# Patient Record
Sex: Female | Born: 1960
Health system: Southern US, Community
[De-identification: ages and names within clinical notes are randomized; demographics above are authoritative.]

## PROBLEM LIST (undated history)

## (undated) DIAGNOSIS — I10 Essential (primary) hypertension: Secondary | ICD-10-CM

## (undated) DIAGNOSIS — E079 Disorder of thyroid, unspecified: Secondary | ICD-10-CM

## (undated) DIAGNOSIS — M1990 Unspecified osteoarthritis, unspecified site: Secondary | ICD-10-CM

---

## 2004-04-17 ENCOUNTER — Encounter: Admission: RE | Admit: 2004-04-17 | Discharge: 2004-04-17 | Payer: Self-pay | Admitting: Internal Medicine

## 2005-03-16 ENCOUNTER — Encounter: Admission: RE | Admit: 2005-03-16 | Discharge: 2005-03-16 | Payer: Self-pay | Admitting: Internal Medicine

## 2005-03-19 ENCOUNTER — Encounter (INDEPENDENT_AMBULATORY_CARE_PROVIDER_SITE_OTHER): Payer: Self-pay | Admitting: Specialist

## 2005-03-19 ENCOUNTER — Ambulatory Visit (HOSPITAL_COMMUNITY): Admission: RE | Admit: 2005-03-19 | Discharge: 2005-03-19 | Payer: Self-pay | Admitting: Obstetrics and Gynecology

## 2006-11-05 ENCOUNTER — Encounter: Admission: RE | Admit: 2006-11-05 | Discharge: 2006-11-05 | Payer: Self-pay | Admitting: Internal Medicine

## 2010-06-30 NOTE — H&P (Signed)
Evelyn Johnston, Evelyn Johnston           ACCOUNT NO.:  [id number]   MEDICAL RECORD NO.:  [id number]          PATIENT TYPE:  AMB   LOCATION:                                FACILITY:  [facility]   PHYSICIAN:  James A. Ashley Royalty, M.D.DATE OF BIRTH:  [birth year]   DATE OF ADMISSION:  03/19/2005  DATE OF DISCHARGE:                                HISTORY & PHYSICAL   HISTORY OF PRESENT ILLNESS:  This is a 50 year old gravida 3, para 1, AB 1.  Last menstrual period January 08, 2005, placing her at approximately 9  weeks 4 days gestation. The patient presented to her primary care physician,  Dr. Ludwig Clarks, today with amenorrhea. A blood pregnancy test was ordered and  the result positive. In addition, an ultrasound was obtained at [facility], which revealed an 8 week 0 day intrauterine pregnancy with no  cardiac activity. The patient consents for D&C due to missed abortion.   MEDICATIONS:  Lotrel.   PAST MEDICAL HISTORY:  Hypertension.   PAST SURGICAL HISTORY:  Negative.   ALLERGIES:  Negative.   FAMILY HISTORY:  Positive for hypertension.   SOCIAL HISTORY:  The patient smokes 1 pack of cigarettes per day. She does  not use alcohol.   REVIEW OF SYSTEMS:  Noncontributory.   PHYSICAL EXAMINATION:  GENERAL:  A well developed, well nourished, pleasant,  black female in no acute distress.  VITAL SIGNS:  Afebrile. Vital signs stable.  SKIN:  Warm and dry without lesions.  CHEST:  Lungs were clear.  CARDIAC:  Regular rate and rhythm.  BREAST:  Examination reveals no dominant masses.  ABDOMEN:  Soft, nontender, without masses or organomegaly. Bowel sounds are  active.  MUSCULOSKELETAL:  No CVA tenderness.  PELVIC:  External genitalia within normal limits. Vagina and cervix were  without gross lesions. Bimanual examination reveals uterus to be  approximately 10 x 6 x 5 cm and no adnexal masses are palpable.   LABORATORY DATA:  Ultrasound from [facility] dated March 16, 2005  reviewed. The study reveals an intrauterine pregnancy of 8 weeks, 0 days  gestation with no fetal heart motion identified, consistent with an  embryonic demise. There is also a 4.1 cm simple left ovarian cyst,  consistent with a corpus luteum.   IMPRESSION:  1.  Missed abortion at approximately [redacted] weeks gestation.  2.  Left adnexal cyst, consistent with a corpus luteum.   PLAN:  Suction, dilatation, and curettage. Risks, benefits, and alternatives  fully discussed with the patient. She states she understands and accepts.  Questions invited and answered.      James A. Ashley Royalty, M.D.  Electronically Signed     JAM/MEDQ  D:  03/16/2005  T:  03/16/2005  Job:  [id number]   cc:   [name], MD  Fax: [phone]

## 2010-06-30 NOTE — Op Note (Signed)
Evelyn Johnston, Evelyn Johnston           ACCOUNT NO.:  [id number]   MEDICAL RECORD NO.:  [id number]          PATIENT TYPE:  AMB   LOCATION:  SDC                           FACILITY:  [facility]   PHYSICIAN:  James A. Ashley Royalty, M.D.DATE OF BIRTH:  08-16-1960   DATE OF PROCEDURE:  03/19/2005  DATE OF DISCHARGE:                                 OPERATIVE REPORT   PREOPERATIVE DIAGNOSIS:  Missed abortion at approximately 8 weeks'  gestation.   POSTOPERATIVE DIAGNOSIS:  Missed abortion at approximately 8 weeks'  gestation, pathology pending.   PROCEDURE:  Suction dilatation and curettage.   SURGEON:  Rudy Jew. Ashley Royalty, M.D.   ANESTHESIA:  Monitoring anesthesia care with 1% Xylocaine paracervical block  (20 mL).   FINDINGS:  The uterus sounded to approximately 11 cm.  A moderate amount of  apparent products of conception was obtained.   ESTIMATED BLOOD LOSS:  Less than 50 mL.   COMPLICATIONS:  None.   PACKS AND DRAINS:  None.   PROCEDURE:  The patient was taken to the operating room and placed in a  dorsal supine position.  After IV sedation was administered, she was placed  in the lithotomy position and prepped and draped in the usual manner for  vaginal surgery.  A posterior weighted retractor was placed per vagina and  the anterior lip of the cervix was grasped with single-tooth tenaculum.  Approximately 20 mL of 1% Xylocaine were instilled circumferentially around  the cervix to create a paracervical block.  The uterus was then gently  sounded to 11 cm.  The cervix was verified as already dilated to  approximately 31-French using Shawnie Pons dilators.  A 10 mm suction curette was  then introduced into the uterus.  Suction was applied and a moderate amount  of apparent products of conception was delivered through the tubing.  After  several passes with the suction curette, no additional tissue was obtained.  At this point the patient was felt to have benefited maximally from the  surgical  procedure.  The tenaculum was removed and a small bleeder from the  tenaculum site was easily closed with a 2-0 chromic catgut in an interrupted  fashion.  Hemostasis was noted and the procedure terminated.   The patient tolerated the procedure extremely well and was returned to the  recovery room in good condition.  Blood type is O+.      James A. Ashley Royalty, M.D.  Electronically Signed     JAM/MEDQ  D:  03/19/2005  T:  03/19/2005  Job:  [id number]

## 2013-12-07 ENCOUNTER — Encounter (HOSPITAL_BASED_OUTPATIENT_CLINIC_OR_DEPARTMENT_OTHER): Payer: Self-pay | Admitting: Emergency Medicine

## 2013-12-07 ENCOUNTER — Emergency Department (HOSPITAL_BASED_OUTPATIENT_CLINIC_OR_DEPARTMENT_OTHER)
Admission: EM | Admit: 2013-12-07 | Discharge: 2013-12-07 | Disposition: A | Discharge status: Home / Self Care | Payer: Self-pay | Attending: Emergency Medicine | Admitting: Emergency Medicine

## 2013-12-07 ENCOUNTER — Emergency Department (HOSPITAL_BASED_OUTPATIENT_CLINIC_OR_DEPARTMENT_OTHER): Payer: Self-pay

## 2013-12-07 DIAGNOSIS — E0590 Thyrotoxicosis, unspecified without thyrotoxic crisis or storm: Secondary | ICD-10-CM | POA: Insufficient documentation

## 2013-12-07 DIAGNOSIS — M25551 Pain in right hip: Secondary | ICD-10-CM | POA: Insufficient documentation

## 2013-12-07 DIAGNOSIS — M25559 Pain in unspecified hip: Secondary | ICD-10-CM

## 2013-12-07 DIAGNOSIS — I10 Essential (primary) hypertension: Secondary | ICD-10-CM | POA: Insufficient documentation

## 2013-12-07 DIAGNOSIS — R251 Tremor, unspecified: Secondary | ICD-10-CM | POA: Insufficient documentation

## 2013-12-07 DIAGNOSIS — M1611 Unilateral primary osteoarthritis, right hip: Secondary | ICD-10-CM | POA: Insufficient documentation

## 2013-12-07 DIAGNOSIS — G8929 Other chronic pain: Secondary | ICD-10-CM | POA: Insufficient documentation

## 2013-12-07 DIAGNOSIS — Z720 Tobacco use: Secondary | ICD-10-CM | POA: Insufficient documentation

## 2013-12-07 HISTORY — DX: Essential (primary) hypertension: I10

## 2013-12-07 HISTORY — DX: Unspecified osteoarthritis, unspecified site: M19.90

## 2013-12-07 HISTORY — DX: Disorder of thyroid, unspecified: E07.9

## 2013-12-07 MED ORDER — KETOROLAC TROMETHAMINE 60 MG/2ML IM SOLN
60.0000 mg | Freq: Once | INTRAMUSCULAR | Status: AC
  Administered 2013-12-07: 60 mg via INTRAMUSCULAR
  Filled 2013-12-07: qty 2

## 2013-12-07 MED ORDER — TRAMADOL HCL 50 MG PO TABS: 50.0000 mg | ORAL_TABLET | Freq: Four times a day (QID) | ORAL | Status: DC | PRN

## 2013-12-07 NOTE — ED Notes (Signed)
PA at bedside discussing test results and dispo plan of care. 

## 2013-12-07 NOTE — ED Notes (Signed)
Pt c/o right hip pain x2 weeks that became worse yesterday. Pt sts she has been taking meloxicam but they are no longer helping. Pt refused wheelchair.

## 2013-12-07 NOTE — Discharge Instructions (Signed)
1. Medications: tramadol, usual home medications 2. Treatment: rest, drink plenty of fluids, gentle stretching 3. Follow Up: Please followup with your primary doctor in 3 days days for discussion of your diagnoses and further evaluation after today's visit; if you do not have a primary care doctor use the resource guide provided to find one; Please return to the ER for fever, chills, confusion or worsening symptoms   Arthralgia Your caregiver has diagnosed you as suffering from an arthralgia. Arthralgia means there is pain in a joint. This can come from many reasons including:  Bruising the joint which causes soreness (inflammation) in the joint.  Wear and tear on the joints which occur as we grow older (osteoarthritis).  Overusing the joint.  Various forms of arthritis.  Infections of the joint. Regardless of the cause of pain in your joint, most of these different pains respond to anti-inflammatory drugs and rest. The exception to this is when a joint is infected, and these cases are treated with antibiotics, if it is a bacterial infection. HOME CARE INSTRUCTIONS   Rest the injured area for as long as directed by your caregiver. Then slowly start using the joint as directed by your caregiver and as the pain allows. Crutches as directed may be useful if the ankles, knees or hips are involved. If the knee was splinted or casted, continue use and care as directed. If an stretchy or elastic wrapping bandage has been applied today, it should be removed and re-applied every 3 to 4 hours. It should not be applied tightly, but firmly enough to keep swelling down. Watch toes and feet for swelling, bluish discoloration, coldness, numbness or excessive pain. If any of these problems (symptoms) occur, remove the ace bandage and re-apply more loosely. If these symptoms persist, contact your caregiver or return to this location.  For the first 24 hours, keep the injured extremity elevated on pillows while  lying down.  Apply ice for 15-20 minutes to the sore joint every couple hours while awake for the first half day. Then 03-04 times per day for the first 48 hours. Put the ice in a plastic bag and place a towel between the bag of ice and your skin.  Wear any splinting, casting, elastic bandage applications, or slings as instructed.  Only take over-the-counter or prescription medicines for pain, discomfort, or fever as directed by your caregiver. Do not use aspirin immediately after the injury unless instructed by your physician. Aspirin can cause increased bleeding and bruising of the tissues.  If you were given crutches, continue to use them as instructed and do not resume weight bearing on the sore joint until instructed. Persistent pain and inability to use the sore joint as directed for more than 2 to 3 days are warning signs indicating that you should see a caregiver for a follow-up visit as soon as possible. Initially, a hairline fracture (break in bone) may not be evident on X-rays. Persistent pain and swelling indicate that further evaluation, non-weight bearing or use of the joint (use of crutches or slings as instructed), or further X-rays are indicated. X-rays may sometimes not show a small fracture until a week or 10 days later. Make a follow-up appointment with your own caregiver or one to whom we have referred you. A radiologist (specialist in reading X-rays) may read your X-rays. Make sure you know how you are to obtain your X-ray results. Do not assume everything is normal if you do not hear from [place]. SEEK MEDICAL CARE  IF: Bruising, swelling, or pain increases. SEEK IMMEDIATE MEDICAL CARE IF:   Your fingers or toes are numb or blue.  The pain is not responding to medications and continues to stay the same or get worse.  The pain in your joint becomes severe.  You develop a fever over 102 F (38.9 C).  It becomes impossible to move or use the joint. MAKE SURE YOU:   Understand  these instructions.  Will watch your condition.  Will get help right away if you are not doing well or get worse. Document Released: 01/29/2005 Document Revised: 04/23/2011 Document Reviewed: 09/17/2007 [facility] Patient Information 2015 [place], [place]. This information is not intended to replace advice given to you by your health care provider. Make sure you discuss any questions you have with your health care provider.   Emergency Department Resource Guide 1) Find a Doctor and Pay Out of Pocket Although you won't have to find out who is covered by your insurance plan, it is a good idea to ask around and get recommendations. You will then need to call the office and see if the doctor you have chosen will accept you as a new patient and what types of options they offer for patients who are self-pay. Some doctors offer discounts or will set up payment plans for their patients who do not have insurance, but you will need to ask so you aren't surprised when you get to your appointment.  2) Contact Your Local Health Department Not all health departments have doctors that can see patients for sick visits, but many do, so it is worth a call to see if yours does. If you don't know where your local health department is, you can check in your phone book. The CDC also has a tool to help you locate your state's health department, and many state websites also have listings of all of their local health departments.  3) Find a Walk-in Clinic If your illness is not likely to be very severe or complicated, you may want to try a walk in clinic. These are popping up all over the country in pharmacies, drugstores, and shopping centers. They're usually staffed by nurse practitioners or physician assistants that have been trained to treat common illnesses and complaints. They're usually fairly quick and inexpensive. However, if you have serious medical issues or chronic medical problems, these are probably not your best  option.  No Primary Care Doctor: - Call Health Connect at  [phone] - they can help you locate a primary care doctor that  accepts your insurance, provides certain services, etc. - Physician Referral Service- [phone]  Chronic Pain Problems: Organization         Address  Phone   Notes  [facility]  [phone]) [phone] Patients need to be referred by their primary care doctor.   Medication Assistance: Organization         Address  Phone   Notes  [facility] [address] [place], [place] [address] [phone]) [phone] --Must be a resident of [facility] [place] -- Must have NO insurance coverage whatsoever (no Medicaid/ Medicare, etc.) -- The pt. MUST have a primary care doctor that directs their care regularly and follows them in the community   Med[facility]ist  [phone]) [phone]   [employer]  [phone]    Agencies that provide inexpensive medical care: Organization         Address  Phone   Notes  [facility]  [phone]) [phone]   [facility]    [phone]   [facility] [address] [place], [place] [address] [phone]) [phone]   [facility] [address], [place] [phone](336) [phone]   Planned Parenthood    [phone]) [phone]   [facility]    [phone](336) [phone]   [facility]  [address], [place] Phone:  [phone]) [phone], Fax:  [phone] Hours of Operation:  9 am - 6 pm, M-F.  Also accepts Medicaid/Medicare and self-pay.  [facility]  [address], [place] Phone: [phone]) [phone], Fax: [phone]. Hours of Operation:  8:30 am - 5:30 pm, M-F.  Also accepts Medicaid and self-pay.  [facility] [place] [address], [place] Phone: [phone]) [phone]   [facility] [address], [place], [place] [phone], Ext. [phone] Mondays & Thursdays: 7-9 AM.  First 15  patients are seen on a first come, first serve basis.    Medicaid-accepting [facility] Providers:  Organization         Address  Phone   Notes  [facility] [address], [place] [phone] Also accepts self-pay patients.  [facility] [address], [place]  [phone]) [phone]   [facility] [address], [place] [phone]) [phone]   [facility] [address] [place] [address], [place] [phone]) [phone]   Renaye RakersVeita Bland [address], [place]   [phone] Only accepts [place] Access [place] patients after they have their name applied to their card.   Self-Pay (no insurance) in [facility] [place]:  Organization         Address  Phone   Notes  Sickle Cell Patients, [facility] [address], [place] [phone]) [phone]   [facility] [address] [place] [address], [place] [phone]) [phone]   [facility] [place]  [address], [place] [phone]) [phone]   [facility]/Dr. Osei-Bonsu  [address] [place] [address], [place] or [id number], [place] [phone]) [phone] Phone number for both [place] and [place] locations is the same.  [facility] [address], [place] [phone]) [phone]   [facility] [address] [place] [address], [place] or [address] [phone]   [facility] [address], [place] [phone]) [phone], phone; [phone]) [phone], fax Sees patients 1st and 3rd Saturday of every month.  Must not qualify for public or private insurance (i.e. Medicaid, Medicare, [place] Health Choice, Veterans' Benefits)  Household income should be no more than 200% of the poverty level The clinic cannot treat you if you are pregnant or think you are pregnant  Sexually transmitted diseases are not treated at the clinic.    Dental  Care: Organization         Address  Phone  Notes  [facility] [address], [place] [phone] Accepts children up to age 52 who are enrolled in [place] or [place] Health Choice; pregnant women with a Medicaid card; and children who have applied for Medicaid or [place] Health Choice, but were declined, whose parents can pay a reduced fee at time of service.  [facility] Department of [employer]Public Health  [place]  [address], [place] [phone] Accepts children up to age 51 who are enrolled in Medicaid or [place] Health Choice; pregnant women with a Medicaid card; and children who have applied for Medicaid or [place] Health Choice, but were declined, whose parents can pay a reduced fee at time of service.  [facility]  [address] [place], [place] [phone] Patients are seen by appointment only. Walk-ins are not accepted. [facility] will see patients 64 years of age and older. Monday - Tuesday (8am-5pm) Most Wednesdays (8:30-5pm) $30 per visit, cash only  [facility] Access [facility]  [address], [facility] [phone] Patients are seen by appointment only. Walk-ins are not accepted. [facility] will see patients 34 years of age and older. One Wednesday Evening (Monthly: Volunteer Based).  $30 per visit, cash only  [employer]  [phone] for adults; Children under age 39, call Graduate Pediatric Dentistry at [phone]. Children aged 56-14, please call [phone] to request a pediatric application.  Dental services are provided in all areas of dental care including fillings, crowns and bridges, complete and partial dentures, implants, gum treatment, root canals, and extractions. Preventive care is also provided. Treatment is provided to both adults and children. Patients are selected via a lottery and there is often a waiting list.   [facility] [address], [place]  [phone] [email or web address]   [facility] [address] [place], [place] [phone], Ext. [phone] Second and Fourth Thursday of each month, opens at 6:30 AM; Clinic ends at 9 AM.  Patients are seen on a first-come first-served basis, and a limited number are seen during each clinic.   [facility]  [address] [place], [place] [phone]   Eligibility Requirements You must have lived in [place], [place], or [place] counties for at least the last three months.   You cannot be eligible for state or federal sponsored [place], including [employer], [place], or [employer].   You generally cannot be eligible for healthcare insurance through your employer.    How to apply: Eligibility screenings are held every Tuesday and Wednesday afternoon from 1:00 pm until 4:00 pm. You do not need an appointment for the interview!  [facility] [address] [place], [place] [phone]   [facility] Health Department  [phone]   [facility] Health Department  [phone]   [facility] Health Department  [phone]    Behavioral Health Resources in the Community: Intensive Outpatient Programs Organization         Address  Phone  Notes  [facility] [address], [place], [place] [phone]   [facility] Outpatient [address] [place], [place] [phone]   [facility] [address], [place], [place]  [phone]   [facility] Mental Health [address],  [place], [place] [phone] or [phone]   Substance Abuse Resources Organization         Address  Phone  Notes  [facility] Drug Services  [phone]   [facility]  [phone]   [facility]  [phone]   Floydene Flock  [phone]   Residential & Outpatient Substance Abuse Program  [phone]    Psychological Services Organization         Address  Phone  Notes  [facility]  [phone]  [phone]   [facility] Services  [phone]   [facility] [address] [place] [phone] or [phone]    Mobile Crisis Teams Organization         Address  Phone  Notes  [facility], Mobile Crisis Care Unit  [phone]   [facility]  [address] [place], [place] [phone]   Doristine Locks [address] [place] [phone]    Self-Help/Support Groups Organization         Address  Phone             Notes  [facility] [place] - variety of support groups  336- I7437963 Call for more information  Narcotics Anonymous (NA), [facility] [address], [employer] [place]  2 meetings at this location   Statistician         Address  Phone  Notes  [facility] [address],    [place]  [phone]   [facility]  [address] [place] [address], [place], [place] [phone]   [facility] [address] [place], [place] [phone] Admissions: 8am-3pm M-F  [facility] [address],    [place], [place] [phone]   [facility] [address] [place], [place], [place] [phone]   [facility] [address]  [place], [place] [phone]   [facility] - Intensive Outpatient [address], [place], [place] [phone]   [facility] ([facility] [address] [place].,  [place], [place] [phone] or [phone]) [phone]   [facility] ([facility]) [address] [place], [place] [phone] Accepts Medicaid  [place] [address]  [place] [phone] Substance Abuse/Addiction Treatment   [facility] Organization         Address  Phone  Notes  [facility]  5124929560   Angie Fava, PhD [address]vin Knack [place], [place]   [phone] or [phone]   [facility]al   [address] [place], [place] [phone]   [facility] [address], [place], [place] [phone] Insurance/Medicaid/sponsorship through [facility] and Families [address]                                    [place], [place] [phone] Therapy/tele-psych/case  [facility] [address], [place] [phone]    Dr. Lolly Mustache  [phone]   [facility]  [employer] [facility] Dept. 1) [address] [place] 2) [address], [place] 3)  [address], [place] [phone]  [phone]   [place] [facility] Child Abuse Hotline 216-086-8250 or [phone] (After Hours)
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
## 2013-12-08 NOTE — ED Provider Notes (Signed)
Medical screening examination/treatment/procedure(s) were performed by non-physician practitioner and as supervising physician I was immediately available for consultation/collaboration.   EKG Interpretation None        Purvis SheffieldForrest Rheagan Nayak, MD 12/08/13 1208

## 2014-05-10 ENCOUNTER — Ambulatory Visit: Payer: Self-pay | Admitting: Family Medicine

## 2015-05-30 ENCOUNTER — Emergency Department (HOSPITAL_BASED_OUTPATIENT_CLINIC_OR_DEPARTMENT_OTHER)
Admission: EM | Admit: 2015-05-30 | Discharge: 2015-05-31 | Disposition: A | Discharge status: Home / Self Care | Payer: Self-pay | Attending: Emergency Medicine | Admitting: Emergency Medicine

## 2015-05-30 ENCOUNTER — Encounter (HOSPITAL_BASED_OUTPATIENT_CLINIC_OR_DEPARTMENT_OTHER): Payer: Self-pay

## 2015-05-30 DIAGNOSIS — M160 Bilateral primary osteoarthritis of hip: Secondary | ICD-10-CM | POA: Insufficient documentation

## 2015-05-30 DIAGNOSIS — F17200 Nicotine dependence, unspecified, uncomplicated: Secondary | ICD-10-CM | POA: Insufficient documentation

## 2015-05-30 DIAGNOSIS — I10 Essential (primary) hypertension: Secondary | ICD-10-CM | POA: Insufficient documentation

## 2015-05-30 LAB — URINALYSIS, ROUTINE W REFLEX MICROSCOPIC
Bilirubin Urine: NEGATIVE
GLUCOSE, UA: NEGATIVE mg/dL
Hgb urine dipstick: NEGATIVE
KETONES UR: NEGATIVE mg/dL
LEUKOCYTES UA: NEGATIVE
Nitrite: NEGATIVE
PROTEIN: NEGATIVE mg/dL
Specific Gravity, Urine: 1.007 (ref 1.005–1.030)
pH: 6.5 (ref 5.0–8.0)

## 2015-05-30 NOTE — ED Notes (Signed)
Pt reports out of BP meds d/t no insurance
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
## 2015-05-30 NOTE — ED Notes (Signed)
Pt reports about 1 month of pain in bilateral hips d/t arthritis.  Reports has seen pcp and given meds but "they are not helping".  Denies injury.  Reports flares intermittently.  Ambualtory.

## 2015-05-31 ENCOUNTER — Emergency Department (HOSPITAL_BASED_OUTPATIENT_CLINIC_OR_DEPARTMENT_OTHER): Payer: Self-pay

## 2015-05-31 MED ORDER — TRAMADOL HCL 50 MG PO TABS
50.0000 mg | ORAL_TABLET | Freq: Once | ORAL | Status: AC
  Administered 2015-05-31: 50 mg via ORAL
  Filled 2015-05-31: qty 1

## 2015-05-31 MED ORDER — LISINOPRIL 10 MG PO TABS: 10.0000 mg | ORAL_TABLET | Freq: Every day | ORAL | Status: DC

## 2015-05-31 MED ORDER — TRAMADOL HCL 50 MG PO TABS: 50.0000 mg | ORAL_TABLET | Freq: Four times a day (QID) | ORAL | Status: DC | PRN

## 2015-05-31 NOTE — ED Notes (Signed)
MD at bedside. 

## 2015-08-06 ENCOUNTER — Emergency Department (HOSPITAL_BASED_OUTPATIENT_CLINIC_OR_DEPARTMENT_OTHER)
Admission: EM | Admit: 2015-08-06 | Discharge: 2015-08-06 | Disposition: A | Discharge status: Home / Self Care | Payer: Self-pay | Attending: Dermatology | Admitting: Dermatology

## 2015-08-06 ENCOUNTER — Encounter (HOSPITAL_BASED_OUTPATIENT_CLINIC_OR_DEPARTMENT_OTHER): Payer: Self-pay | Admitting: *Deleted

## 2015-08-06 DIAGNOSIS — Z79899 Other long term (current) drug therapy: Secondary | ICD-10-CM | POA: Insufficient documentation

## 2015-08-06 DIAGNOSIS — Z5321 Procedure and treatment not carried out due to patient leaving prior to being seen by health care provider: Secondary | ICD-10-CM | POA: Insufficient documentation

## 2015-08-06 DIAGNOSIS — F17210 Nicotine dependence, cigarettes, uncomplicated: Secondary | ICD-10-CM | POA: Insufficient documentation

## 2015-08-06 DIAGNOSIS — K0889 Other specified disorders of teeth and supporting structures: Secondary | ICD-10-CM | POA: Insufficient documentation

## 2015-08-06 NOTE — ED Notes (Signed)
Patient advised registration that she did not want to wait to be seen. Patient left without being seen.

## 2015-08-06 NOTE — ED Notes (Signed)
Per pt report pain near rt ear and shooting along jaw. Pain when chewing on rt side , taken otc for pain. No fever.

## 2015-08-06 NOTE — ED Notes (Signed)
Per pt report pt out of lisinopril medications, current blood pressure elevated.

## 2017-02-16 ENCOUNTER — Emergency Department (HOSPITAL_BASED_OUTPATIENT_CLINIC_OR_DEPARTMENT_OTHER): Payer: Medicare Other

## 2017-02-16 ENCOUNTER — Other Ambulatory Visit: Payer: Self-pay

## 2017-02-16 ENCOUNTER — Inpatient Hospital Stay (HOSPITAL_BASED_OUTPATIENT_CLINIC_OR_DEPARTMENT_OTHER)
Admission: EM | Admit: 2017-02-16 | Discharge: 2017-02-18 | DRG: 065 | Disposition: A | Discharge status: Home / Self Care | Payer: Medicare Other | Attending: Family Medicine | Admitting: Family Medicine

## 2017-02-16 ENCOUNTER — Encounter (HOSPITAL_BASED_OUTPATIENT_CLINIC_OR_DEPARTMENT_OTHER): Payer: Self-pay | Admitting: Emergency Medicine

## 2017-02-16 DIAGNOSIS — R112 Nausea with vomiting, unspecified: Secondary | ICD-10-CM | POA: Diagnosis present

## 2017-02-16 DIAGNOSIS — Z79899 Other long term (current) drug therapy: Secondary | ICD-10-CM

## 2017-02-16 DIAGNOSIS — I10 Essential (primary) hypertension: Secondary | ICD-10-CM | POA: Diagnosis present

## 2017-02-16 DIAGNOSIS — I6340 Cerebral infarction due to embolism of unspecified cerebral artery: Principal | ICD-10-CM | POA: Diagnosis present

## 2017-02-16 DIAGNOSIS — I639 Cerebral infarction, unspecified: Secondary | ICD-10-CM | POA: Diagnosis present

## 2017-02-16 DIAGNOSIS — R0602 Shortness of breath: Secondary | ICD-10-CM | POA: Diagnosis present

## 2017-02-16 DIAGNOSIS — Z87891 Personal history of nicotine dependence: Secondary | ICD-10-CM

## 2017-02-16 DIAGNOSIS — I4891 Unspecified atrial fibrillation: Secondary | ICD-10-CM | POA: Diagnosis present

## 2017-02-16 DIAGNOSIS — E0590 Thyrotoxicosis, unspecified without thyrotoxic crisis or storm: Secondary | ICD-10-CM | POA: Diagnosis present

## 2017-02-16 DIAGNOSIS — I4892 Unspecified atrial flutter: Secondary | ICD-10-CM | POA: Diagnosis present

## 2017-02-16 DIAGNOSIS — R402362 Coma scale, best motor response, obeys commands, at arrival to emergency department: Secondary | ICD-10-CM | POA: Diagnosis present

## 2017-02-16 DIAGNOSIS — R402142 Coma scale, eyes open, spontaneous, at arrival to emergency department: Secondary | ICD-10-CM | POA: Diagnosis present

## 2017-02-16 DIAGNOSIS — I160 Hypertensive urgency: Secondary | ICD-10-CM | POA: Diagnosis present

## 2017-02-16 DIAGNOSIS — I63411 Cerebral infarction due to embolism of right middle cerebral artery: Secondary | ICD-10-CM | POA: Diagnosis not present

## 2017-02-16 DIAGNOSIS — R402252 Coma scale, best verbal response, oriented, at arrival to emergency department: Secondary | ICD-10-CM | POA: Diagnosis present

## 2017-02-16 DIAGNOSIS — Z885 Allergy status to narcotic agent status: Secondary | ICD-10-CM

## 2017-02-16 DIAGNOSIS — R292 Abnormal reflex: Secondary | ICD-10-CM | POA: Diagnosis present

## 2017-02-16 LAB — URINALYSIS, ROUTINE W REFLEX MICROSCOPIC
Bilirubin Urine: NEGATIVE
Glucose, UA: NEGATIVE mg/dL
HGB URINE DIPSTICK: NEGATIVE
Ketones, ur: NEGATIVE mg/dL
Leukocytes, UA: NEGATIVE
NITRITE: NEGATIVE
PH: 6.5 (ref 5.0–8.0)
Protein, ur: NEGATIVE mg/dL
SPECIFIC GRAVITY, URINE: 1.025 (ref 1.005–1.030)

## 2017-02-16 LAB — CBC WITH DIFFERENTIAL/PLATELET
BASOS ABS: 0 10*3/uL (ref 0.0–0.1)
BASOS PCT: 0 %
Eosinophils Absolute: 0.2 10*3/uL (ref 0.0–0.7)
Eosinophils Relative: 2 %
HEMATOCRIT: 36.9 % (ref 36.0–46.0)
Hemoglobin: 12.3 g/dL (ref 12.0–15.0)
LYMPHS PCT: 50 %
Lymphs Abs: 3.2 10*3/uL (ref 0.7–4.0)
MCH: 26.5 pg (ref 26.0–34.0)
MCHC: 33.3 g/dL (ref 30.0–36.0)
MCV: 79.5 fL (ref 78.0–100.0)
MONO ABS: 0.8 10*3/uL (ref 0.1–1.0)
Monocytes Relative: 12 %
NEUTROS PCT: 36 %
Neutro Abs: 2.4 10*3/uL (ref 1.7–7.7)
Platelets: 274 10*3/uL (ref 150–400)
RBC: 4.64 MIL/uL (ref 3.87–5.11)
RDW: 14.1 % (ref 11.5–15.5)
WBC: 6.6 10*3/uL (ref 4.0–10.5)

## 2017-02-16 LAB — BASIC METABOLIC PANEL
ANION GAP: 8 (ref 5–15)
BUN: 9 mg/dL (ref 6–20)
CO2: 24 mmol/L (ref 22–32)
Calcium: 9.3 mg/dL (ref 8.9–10.3)
Chloride: 107 mmol/L (ref 101–111)
Creatinine, Ser: 0.62 mg/dL (ref 0.44–1.00)
GLUCOSE: 101 mg/dL — AB (ref 65–99)
POTASSIUM: 4 mmol/L (ref 3.5–5.1)
SODIUM: 139 mmol/L (ref 135–145)

## 2017-02-16 LAB — RAPID URINE DRUG SCREEN, HOSP PERFORMED
AMPHETAMINES: NOT DETECTED
BENZODIAZEPINES: NOT DETECTED
Barbiturates: NOT DETECTED
Cocaine: NOT DETECTED
OPIATES: NOT DETECTED
Tetrahydrocannabinol: NOT DETECTED

## 2017-02-16 LAB — TROPONIN I

## 2017-02-16 MED ORDER — ACETAMINOPHEN 160 MG/5ML PO SOLN: 650.0000 mg | ORAL | Status: DC | PRN

## 2017-02-16 MED ORDER — ACETAMINOPHEN 650 MG RE SUPP: 650.0000 mg | RECTAL | Status: DC | PRN

## 2017-02-16 MED ORDER — METOPROLOL TARTRATE 5 MG/5ML IV SOLN: 2.5000 mg | INTRAVENOUS | Status: DC | PRN

## 2017-02-16 MED ORDER — SODIUM CHLORIDE 0.9 % IV BOLUS (SEPSIS)
500.0000 mL | Freq: Once | INTRAVENOUS | Status: AC
  Administered 2017-02-16: 500 mL via INTRAVENOUS

## 2017-02-16 MED ORDER — ENOXAPARIN SODIUM 80 MG/0.8ML ~~LOC~~ SOLN
70.0000 mg | Freq: Two times a day (BID) | SUBCUTANEOUS | Status: DC
  Administered 2017-02-17 (×2): 70 mg via SUBCUTANEOUS
  Filled 2017-02-16 (×2): qty 0.8

## 2017-02-16 MED ORDER — LABETALOL HCL 5 MG/ML IV SOLN
10.0000 mg | Freq: Once | INTRAVENOUS | Status: AC
  Administered 2017-02-16: 10 mg via INTRAVENOUS
  Filled 2017-02-16: qty 4

## 2017-02-16 MED ORDER — METOCLOPRAMIDE HCL 5 MG/ML IJ SOLN
10.0000 mg | Freq: Once | INTRAMUSCULAR | Status: AC
  Administered 2017-02-16: 10 mg via INTRAVENOUS
  Filled 2017-02-16: qty 2

## 2017-02-16 MED ORDER — STROKE: EARLY STAGES OF RECOVERY BOOK
Freq: Once | Status: AC
  Administered 2017-02-17: 01:00:00
  Filled 2017-02-16: qty 1

## 2017-02-16 MED ORDER — ACETAMINOPHEN 325 MG PO TABS
650.0000 mg | ORAL_TABLET | ORAL | Status: DC | PRN
  Administered 2017-02-17 – 2017-02-18 (×2): 650 mg via ORAL
  Filled 2017-02-16 (×2): qty 2

## 2017-02-16 MED ORDER — METOPROLOL TARTRATE 5 MG/5ML IV SOLN: 2.5000 mg | INTRAVENOUS | Status: DC

## 2017-02-16 MED ORDER — IBUPROFEN 400 MG PO TABS
400.0000 mg | ORAL_TABLET | Freq: Once | ORAL | Status: AC | PRN
  Administered 2017-02-16: 400 mg via ORAL
  Filled 2017-02-16: qty 1

## 2017-02-16 MED ORDER — METOPROLOL TARTRATE 25 MG PO TABS: 25.0000 mg | ORAL_TABLET | Freq: Two times a day (BID) | ORAL | Status: DC

## 2017-02-16 NOTE — Consult Note (Signed)
Referring Physician: Dr. Tamala Julian    Chief Complaint: Right sided headache with paresthesias  HPI: Evelyn Johnston is an 57 y.o. female who presented with a 3 day history of severe 9/10 throbbing right sided headache in conjunction with N/V and paresthesias. On initial presentation to the hospital she was afebrile, pulse 78-87, respirations 18-20, blood pressure 158/93 - 174/107, and O2 saturations maintained on room air. Lab workup was unremarkable.  EKG revealed new onset atrial fibrillation at 89 bpm.  Urinalysis was negative for any acute abnormalities.    CT scan of the brain showed a small crescentic cortically-based low-attenuation focus along the high convexity of the right parietal region concerning for subacute stroke. The patient was out of the window for any intervention.   Her PMHx includes HTN, hyperthyroidism, and arthritis.  Past Medical History:  Diagnosis Date  . Arthritis   . Hypertension   . Thyroid disease     History reviewed. No pertinent surgical history.  No family history on file. Social History:  reports that she has quit smoking. Her smoking use included cigarettes. She smoked 0.50 packs per day. she has never used smokeless tobacco. She reports that she does not drink alcohol or use drugs.  Allergies:  Allergies  Allergen Reactions  . Codeine Nausea And Vomiting  . Oxycontin [Oxycodone Hcl] Nausea And Vomiting    Home Medications:  Lisinopril  ROS: Mild right sided neck pain. Otherwise, no current complaints. Initial symptoms as per HPI.   Physical Examination: Blood pressure (!) 171/119, pulse (!) 101, temperature 97.9 F (36.6 C), resp. rate (!) 21, height '5\' 2"'  (1.575 m), weight 72.5 kg (159 lb 13.3 oz), SpO2 97 %.  HEENT: [place]/AT Lungs: Respirations unlabored Ext: No edema  Neurologic Examination: Mental Status: Alert, oriented, thought content appropriate. Speech fluent without evidence of aphasia.  Able to follow all commands without  difficulty. Cranial Nerves: II:  Visual fields intact without extinction to DSS. PERRL 5 mm >> 3 mm  III,IV, VI: EOMI without nystagmus. No ptosis.  V,VII: No facial droop. Facial temp sensation equal bilaterally VIII: hearing intact to conversation IX,X: Palate rises symmetrically XI: Symmetric XII: midline tongue extension  Motor: Right : Upper extremity   5/5    Left:     Upper extremity   5/5  Lower extremity   5/5     Lower extremity   5/5 No asymmetry noted.  Normal tone throughout; no atrophy noted. Sensory: Temp and light touch intact x 4 without extinction. Deep Tendon Reflexes:  2+ right brachioradialis and biceps. 3+ left brachioradialis and biceps 3+ bilateral patellae, 2+ achilles bilaterally.  Toes downgoing bilaterally. Cerebellar: No ataxia with FNF bilaterally.  Gait: Deferred  Results for orders placed or performed during the hospital encounter of 02/16/17 (from the past 48 hour(s))  Basic metabolic panel     Status: Abnormal   Collection Time: 02/16/17  6:24 PM  Result Value Ref Range   Sodium 139 135 - 145 mmol/L   Potassium 4.0 3.5 - 5.1 mmol/L   Chloride 107 101 - 111 mmol/L   CO2 24 22 - 32 mmol/L   Glucose, Bld 101 (H) 65 - 99 mg/dL   BUN 9 6 - 20 mg/dL   Creatinine, Ser 0.62 0.44 - 1.00 mg/dL   Calcium 9.3 8.9 - 10.3 mg/dL   GFR calc non Af Amer >60 >60 mL/min   GFR calc Af Amer >60 >60 mL/min    Comment: (NOTE) The eGFR has been calculated using  the CKD EPI equation. This calculation has not been validated in all clinical situations. eGFR's persistently <60 mL/min signify possible Chronic Kidney Disease.    Anion gap 8 5 - 15  CBC with Differential     Status: None   Collection Time: 02/16/17  6:24 PM  Result Value Ref Range   WBC 6.6 4.0 - 10.5 K/uL   RBC 4.64 3.87 - 5.11 MIL/uL   Hemoglobin 12.3 12.0 - 15.0 g/dL   HCT 36.9 36.0 - 46.0 %   MCV 79.5 78.0 - 100.0 fL   MCH 26.5 26.0 - 34.0 pg   MCHC 33.3 30.0 - 36.0 g/dL   RDW 14.1 11.5 -  15.5 %   Platelets 274 150 - 400 K/uL   Neutrophils Relative % 36 %   Neutro Abs 2.4 1.7 - 7.7 K/uL   Lymphocytes Relative 50 %   Lymphs Abs 3.2 0.7 - 4.0 K/uL   Monocytes Relative 12 %   Monocytes Absolute 0.8 0.1 - 1.0 K/uL   Eosinophils Relative 2 %   Eosinophils Absolute 0.2 0.0 - 0.7 K/uL   Basophils Relative 0 %   Basophils Absolute 0.0 0.0 - 0.1 K/uL  Troponin I     Status: None   Collection Time: 02/16/17  6:24 PM  Result Value Ref Range   Troponin I <0.03 <0.03 ng/mL  Urine rapid drug screen (hosp performed)     Status: None   Collection Time: 02/16/17  7:35 PM  Result Value Ref Range   Opiates NONE DETECTED NONE DETECTED   Cocaine NONE DETECTED NONE DETECTED   Benzodiazepines NONE DETECTED NONE DETECTED   Amphetamines NONE DETECTED NONE DETECTED   Tetrahydrocannabinol NONE DETECTED NONE DETECTED   Barbiturates NONE DETECTED NONE DETECTED    Comment: (NOTE) DRUG SCREEN FOR MEDICAL PURPOSES ONLY.  IF CONFIRMATION IS NEEDED FOR ANY PURPOSE, NOTIFY LAB WITHIN 5 DAYS. LOWEST DETECTABLE LIMITS FOR URINE DRUG SCREEN Drug Class                     Cutoff (ng/mL) Amphetamine and metabolites    1000 Barbiturate and metabolites    200 Benzodiazepine                 027 Tricyclics and metabolites     300 Opiates and metabolites        300 Cocaine and metabolites        300 THC                            50   Urinalysis, Routine w reflex microscopic     Status: Abnormal   Collection Time: 02/16/17  7:35 PM  Result Value Ref Range   Color, Urine YELLOW YELLOW   APPearance CLOUDY (A) CLEAR   Specific Gravity, Urine 1.025 1.005 - 1.030   pH 6.5 5.0 - 8.0   Glucose, UA NEGATIVE NEGATIVE mg/dL   Hgb urine dipstick NEGATIVE NEGATIVE   Bilirubin Urine NEGATIVE NEGATIVE   Ketones, ur NEGATIVE NEGATIVE mg/dL   Protein, ur NEGATIVE NEGATIVE mg/dL   Nitrite NEGATIVE NEGATIVE   Leukocytes, UA NEGATIVE NEGATIVE    Comment: Microscopic not done on urines with negative protein,  blood, leukocytes, nitrite, or glucose < 500 mg/dL.   Ct Head Wo Contrast  Result Date: 02/16/2017 CLINICAL DATA:  Headaches for 3 days. Left hand tingling. Left hand numbness. Hypertension and blurry vision. EXAM: CT HEAD WITHOUT CONTRAST TECHNIQUE:  Contiguous axial images were obtained from the base of the skull through the vertex without intravenous contrast. COMPARISON:  None. FINDINGS: Brain: There is a small focal area of vague low-attenuation demonstrated in the right posterior parietal/ occipital region. This could represent early changes of acute ischemia. Consider MRI for further characterization if clinically indicated. No mass effect or midline shift. No abnormal extra-axial fluid collections. Gray-white matter junctions are distinct. Basal cisterns are not effaced. No acute intracranial hemorrhage. Ventricles are not dilated. Vascular: No hyperdense vessel or unexpected calcification. Skull: Normal. Negative for fracture or focal lesion. Sinuses/Orbits: No acute finding. Other: None. IMPRESSION: Vague low-attenuation focus demonstrated in the right posterior parietal/ occipital region possibly representing early changes of acute ischemia. Consider MRI for further characterization if clinically indicated. No acute intracranial hemorrhage or mass effect. Electronically Signed   By: Lucienne Capers M.D.   On: 02/16/2017 19:21    Assessment: 57 y.o. female presenting with right sided headache and paresthesias 1. Neurological exam reveals hyperreflexia of LUE. No strength or sensory deficit noted.  2. CT head reveals a small crescentic cortically-based low-attenuation focus along the high convexity of the right parietal region concerning for subacute stroke. Most likely cardioembolic given newly diagnosed atrial fibrillation. Other DDx includes vasculitis and multiple sclerosis. 3. Heart monitor reveals atrial flutter at the time of Neurology assessment Stroke Risk Factors - HTN   Plan: 1.  HgbA1c, fasting lipid panel 2. MRI, MRA of the brain without contrast 3. PT consult, OT consult, Speech consult 4. Echocardiogram 5. Carotid dopplers 6. Agree with therapeutic Lovenox to anticoagulate until full stroke work up is completed and decision regarding type of oral anticoagulation can be made 7. Risk factor modification 8. Telemetry monitoring 9. Frequent neuro checks 10. Lipitor 40 mg po qd. Obtain baseline CK level and LFTs 11. BP management. Out of permissive HTN time window.     '@Electronically'  signed: Dr. Kerney Elbe  02/16/2017, 11:25 PM

## 2017-02-16 NOTE — ED Triage Notes (Signed)
Headache x 3 days on the R side with N/V

## 2017-02-16 NOTE — Progress Notes (Signed)
ANTICOAGULATION CONSULT NOTE - Initial Consult  Pharmacy Consult for Lovenox  Indication: atrial fibrillation  Allergies  Allergen Reactions  . Codeine Nausea And Vomiting  . Oxycontin [Oxycodone Hcl] Nausea And Vomiting    Patient Measurements: Height: 5\' 2"  (157.5 cm) Weight: 159 lb 13.3 oz (72.5 kg) IBW/kg (Calculated) : 50.1  Vital Signs: Temp: 97.9 F (36.6 C) (01/05 1943) Temp Source: Oral (01/05 1634) BP: 171/119 (01/05 2139) Pulse Rate: 101 (01/05 2139)  Labs: Recent Labs    02/16/17 1824  HGB 12.3  HCT 36.9  PLT 274  CREATININE 0.62  TROPONINI <0.03    Estimated Creatinine Clearance: 74.1 mL/min (by C-G formula based on SCr of 0.62 mg/dL).   Medical History: Past Medical History:  Diagnosis Date  . Arthritis   . Hypertension   . Thyroid disease     Medications:  Medications Prior to Admission  Medication Sig Dispense Refill Last Dose  . lisinopril (PRINIVIL) 10 MG tablet Take 1 tablet (10 mg total) by mouth daily. 30 tablet 0     Assessment: 57 y.o. female with new onset Afib for Lovenox Goal of Therapy:  Full anticoagulation with Lovenox Monitor platelets by anticoagulation protocol: Yes   Plan:  Lovenox 70 mg SQ q12h  Eddie Candlebbott, Evelyn Johnston 02/16/2017,11:34 PM

## 2017-02-16 NOTE — ED Notes (Signed)
ED Provider at bedside. 

## 2017-02-16 NOTE — H&P (Signed)
History and Physical    Evelyn Johnston WUJ:[id number] DOB: 01/25/1961 DOA: 02/16/2017  Referring MD/NP/PA: Dr. Tilden FossaElizabeth Rees [facility]  Patient coming from: Home  Chief Complaint: Headache  I have personally briefly reviewed patient's old medical records [facility]   HPI: Evelyn Johnston is a 57 y.o. female with medical history significant of HTN, hyperthyroidism,  arthritis, and H/O tobacco abuse; who presented with complaints of progressively worsening right-sided headache along with nausea and vomiting the last 3 days.  Describes the consistency of the headache as sharp in nature and constant.  Denied having any significant photophobia or sensitivity to sound, but normally she does not experience headaches.  Tried taking Tylenol, extra blood pressure medication after seeing her blood pressure elevated as high as 163/124 at home, and sinus medication without relief. Associated symptoms include nosebleed(x1 episode and self resolved), mild shortness of breath, persistent nausea, one episode of vomiting, and blurry vision the right now resolved.  Patient denies any family history of clotting disorder.  She reports quitting smoking approximately 8 months ago but previously smoked for 15 years never smoking anywhere from 2-3 packs/day.  Lastly patient notes history of hyperthyroidism for which she is supposed to be on methimazole, but has not taken this medicine in at least 2 weeks.  Prior to being off the medicine for the last 2 weeks she has been stretching out the methimazole taking it intermittently.  Her endocrinologist [facility] and she last seen 09/26/2016.  She reports missing her follow-up appointment  ED Course:[facility] patient was noted to be afebrile, pulse 78-87, respirations 18-20, blood pressure 158/93 - 174/107, and O2 saturations maintained on room air.  Lab workup  consisting of CBC, BMP, UDS, and urinalysis were unremarkable. EKG revealed new onset atrial fibrillation at 89 bpm.  CT scan of the brain showed a vague low-attenuation focus demonstrated in the right posterior parietal/ occipital region concerning for subacute stroke.  Neurology was not initially consulted, but will need to on arrival. Patient appears out of the window for any intervention.  Accepted to a telemetry bed as obs. Asked Dr. Madilyn Hookees to add-on to previous labs a T4 and TSH prior to transport.  Notified while patient in transport that heart rates increased to 130 which patient had been given 10 mg of labetalol IV with heart rates now 100-120s.   Review of Systems  Constitutional: Negative for chills and fever.  HENT: Negative for ear discharge and nosebleeds.   Eyes: Positive for blurred vision. Negative for pain.  Respiratory: Positive for shortness of breath. Negative for hemoptysis.   Cardiovascular: Positive for palpitations. Negative for chest pain and leg swelling.  Gastrointestinal: Positive for nausea and vomiting.  Genitourinary: Negative for dysuria and urgency.  Musculoskeletal: Positive for joint pain. Negative for falls.  Skin: Negative for itching and rash.  Neurological: Positive for tingling and headaches. Negative for speech change and focal weakness.  Psychiatric/Behavioral: Negative for substance abuse and suicidal ideas.    Past Medical History:  Diagnosis Date  . Arthritis   . Hypertension   . Thyroid disease     History reviewed. No pertinent surgical history.   reports that she has quit smoking. Her smoking use included cigarettes. She smoked 0.50 packs per day. she has never used smokeless tobacco. She reports that she does not drink alcohol or use drugs.  Allergies  Allergen Reactions  . Codeine Nausea And  Vomiting  . Oxycontin [Oxycodone Hcl] Nausea And Vomiting    Family history positive for hyperthyroidism  Prior to Admission medications     Medication Sig Start Date End Date Taking? Authorizing Provider  lisinopril (PRINIVIL) 10 MG tablet Take 1 tablet (10 mg total) by mouth daily. 05/31/15   Molpus, John, MD    Physical Exam:  Constitutional: NAD, calm, comfortable Vitals:   02/16/17 2111 02/16/17 2130 02/16/17 2139 02/16/17 2237  BP: (!) 160/110 (!) 174/121 (!) 171/119   Pulse: 93 92 (!) 101   Resp:  (!) 21 (!) 21   Temp:      TempSrc:      SpO2: 97% 96% 97%   Weight:    72.5 kg (159 lb 13.3 oz)  Height:    5\' 2"  (1.575 m)   Eyes: PERRL, lids and conjunctivae normal ENMT: Mucous membranes are moist. Posterior pharynx clear of any exudate or lesions.  Neck: normal, supple, no masses, no thyromegaly Respiratory: Mildly tachypneic with no significant wheezes or rhonchi appreciated. Cardiovascular: Irregularly irregular no murmurs / rubs / gallops.  Trace bilateral pretibial edema. 2+ pedal pulses. No carotid bruits.  Abdomen: no tenderness, no masses palpated. No hepatosplenomegaly. Bowel sounds positive.  Musculoskeletal: no clubbing / cyanosis. No joint deformity upper and lower extremities. Good ROM, no contractures. Normal muscle tone.  Skin: no rashes, lesions, ulcers. No induration Neurologic: CN 2-12 grossly intact. Sensation intact, DTR normal. Strength 5/5 in all 4.  Psychiatric: Normal judgment and insight. Alert and oriented x 3. Normal mood.     Labs on Admission: I have personally reviewed following labs and imaging studies  CBC: Recent Labs  Lab 02/16/17 1824  WBC 6.6  NEUTROABS 2.4  HGB 12.3  HCT 36.9  MCV 79.5  PLT 274   Basic Metabolic Panel: Recent Labs  Lab 02/16/17 1824  NA 139  K 4.0  CL 107  CO2 24  GLUCOSE 101*  BUN 9  CREATININE 0.62  CALCIUM 9.3   GFR: Estimated Creatinine Clearance: 74.1 mL/min (by C-G formula based on SCr of 0.62 mg/dL). Liver Function Tests: No results for input(s): AST, ALT, ALKPHOS, BILITOT, PROT, ALBUMIN in the last 168 hours. No results for  input(s): LIPASE, AMYLASE in the last 168 hours. No results for input(s): AMMONIA in the last 168 hours. Coagulation Profile: No results for input(s): INR, PROTIME in the last 168 hours. Cardiac Enzymes: Recent Labs  Lab 02/16/17 1824  TROPONINI <0.03   BNP (last 3 results) No results for input(s): PROBNP in the last 8760 hours. HbA1C: No results for input(s): HGBA1C in the last 72 hours. CBG: No results for input(s): GLUCAP in the last 168 hours. Lipid Profile: No results for input(s): CHOL, HDL, LDLCALC, TRIG, CHOLHDL, LDLDIRECT in the last 72 hours. Thyroid Function Tests: No results for input(s): TSH, T4TOTAL, FREET4, T3FREE, THYROIDAB in the last 72 hours. Anemia Panel: No results for input(s): VITAMINB12, FOLATE, FERRITIN, TIBC, IRON, RETICCTPCT in the last 72 hours. Urine analysis:    Component Value Date/Time   COLORURINE YELLOW 02/16/2017 1935   APPEARANCEUR CLOUDY (A) 02/16/2017 1935   LABSPEC 1.025 02/16/2017 1935   PHURINE 6.5 02/16/2017 1935   GLUCOSEU NEGATIVE 02/16/2017 1935   HGBUR NEGATIVE 02/16/2017 1935   BILIRUBINUR NEGATIVE 02/16/2017 1935   KETONESUR NEGATIVE 02/16/2017 1935   PROTEINUR NEGATIVE 02/16/2017 1935   NITRITE NEGATIVE 02/16/2017 1935   LEUKOCYTESUR NEGATIVE 02/16/2017 1935   Sepsis Labs: No results found for this or any previous visit (  from the past 240 hour(s)).   Radiological Exams on Admission: Ct Head Wo Contrast  Result Date: 02/16/2017 CLINICAL DATA:  Headaches for 3 days. Left hand tingling. Left hand numbness. Hypertension and blurry vision. EXAM: CT HEAD WITHOUT CONTRAST TECHNIQUE: Contiguous axial images were obtained from the base of the skull through the vertex without intravenous contrast. COMPARISON:  None. FINDINGS: Brain: There is a small focal area of vague low-attenuation demonstrated in the right posterior parietal/ occipital region. This could represent early changes of acute ischemia. Consider MRI for further  characterization if clinically indicated. No mass effect or midline shift. No abnormal extra-axial fluid collections. Gray-white matter junctions are distinct. Basal cisterns are not effaced. No acute intracranial hemorrhage. Ventricles are not dilated. Vascular: No hyperdense vessel or unexpected calcification. Skull: Normal. Negative for fracture or focal lesion. Sinuses/Orbits: No acute finding. Other: None. IMPRESSION: Vague low-attenuation focus demonstrated in the right posterior parietal/ occipital region possibly representing early changes of acute ischemia. Consider MRI for further characterization if clinically indicated. No acute intracranial hemorrhage or mass effect. Electronically Signed   By: Burman Nieves M.D.   On: 02/16/2017 19:21    EKG: Independently reviewed.  Atrial fibrillation initially at 89 bpm  Assessment/Plan Suspected subacute stroke: Patient presents with 3 days of right-sided headache.  CT scan of the brain showed signs of possible right parietal and occipital infarct. - To a telemetry bed - stroke order set initiated - Neuro checks - Check  MRI/MRA head w/o contrast - Vas carotid U/S  - PT/OT/Speech to eval and treat - Check echocardiogram - Check Hemoglobin A1c and lipid panel in a.m. - Appreciate Dr. Otelia Limes of neurology consultative services, will follow-up for further recommendations  New-onset atrial fibrillation:   Patient denies previous history of atrial fibrillation.  Heart rate seen to be elevated up to 130.  Risk factors include history of hyperthyroidism. - Lovenox per pharmacy - Follow-up echocardiogram - Propranolol 10 mg bid x 1 dose now given , changed to 5 mg BID  Hyperthyroidism: Patient had been on methimazole at and had been stressing doses she had not maintained routine follow-up with her endocrinologist and notes for the last 2 weeks she is been completely without this medicine.  Last available check patient's free T4 was 5.3. -  Follow-up free T4 and TSH - Will need to restart methimazole   Hypertensive urgency: blood pressures initially noted to be as high as 189/110. - Continue to monitor  Nausea and vomiting - Antiemetics as needed   DVT prophylaxis: lovenox Code Status: Full  Family Communication: none present at bedside Disposition Plan: Tbd Consults called: Neurology Admission status: Inpatient  Clydie Braun MD [facility] Pager [phone]   If 7PM-7AM, please contact night-coverage [email or web address] Password [facility]  02/16/2017, 10:39 PM

## 2017-02-16 NOTE — ED Provider Notes (Signed)
[place] [facility]W PROGRESSIVE CARE Provider Note   CSN: [id number] Arrival date & time: 02/16/17  1420     History   Chief Complaint Chief Complaint  Patient presents with  . Headache    HPI Alyson Ki is a 57 y.o. female.  The history is provided by the patient. No language interpreter was used.  Headache     Tagan Bartram is a 57 y.o. female who presents to the Emergency Department complaining of headache.  She reports right-sided headache and that began 3 days ago upon awakening.  Headache is throbbing and constant in nature and is gradually worsened over time.  She has associated photophobia.  Last night she developed nausea and vomiting.  She feels like her vision is mildly blurred.  She thought this was related to high blood pressure and she took an extra lisinopril.  She had no significant improvement with that.  She also took Tylenol with no relief.  She does not have a history of headaches and this is very unusual for her.  No fevers, diarrhea, chest pain.  She did have some tingling sensation in her left hand the day before her headache began. Past Medical History:  Diagnosis Date  . Arthritis   . Hypertension   . Thyroid disease     Patient Active Problem List   Diagnosis Date Noted  . CVA (cerebral vascular accident) (HCC) 02/16/2017    History reviewed. No pertinent surgical history.  OB History    No data available       Home Medications    Prior to Admission medications   Medication Sig Start Date End Date Taking? Authorizing Provider  lisinopril (PRINIVIL) 10 MG tablet Take 1 tablet (10 mg total) by mouth daily. 05/31/15   Molpus, John, MD    Family History No family history on file.  Social History Social History   Tobacco Use  . Smoking status: Former Smoker    Packs/day: 0.50    Types: Cigarettes  . Smokeless tobacco: Never Used  Substance Use Topics  . Alcohol use: No  . Drug use: No     Allergies   Codeine and Oxycontin  [oxycodone hcl]   Review of Systems Review of Systems  Neurological: Positive for headaches.  All other systems reviewed and are negative.    Physical Exam Updated Vital Signs BP (!) 171/119   Pulse (!) 101   Temp 97.9 F (36.6 C)   Resp (!) 21   Ht 5\' 2"  (1.575 m)   Wt 72.5 kg (159 lb 13.3 oz)   SpO2 97%   BMI 29.23 kg/m   Physical Exam  Constitutional: She is oriented to person, place, and time. She appears well-developed and well-nourished.  HENT:  Head: Normocephalic and atraumatic.  Right Ear: External ear normal.  Left Ear: External ear normal.  Mouth/Throat: Oropharynx is clear and moist.  Eyes: EOM are normal. Pupils are equal, round, and reactive to light.  photophobia  Neck: Neck supple.  Cardiovascular: Normal rate and regular rhythm.  No murmur heard. Pulmonary/Chest: Effort normal and breath sounds normal. No respiratory distress.  Abdominal: Soft. There is no tenderness. There is no rebound and no guarding.  Musculoskeletal: She exhibits no edema or tenderness.  Neurological: She is alert and oriented to person, place, and time.  No facial asymmetry.  5 out of 5 strength in all 4 extremities with sensation to light touch intact in all 4 extremities.  Visual fields are grossly intact.  Skin: Skin  is warm and dry.  Psychiatric: She has a normal mood and affect. Her behavior is normal.  Nursing note and vitals reviewed.    ED Treatments / Results  Labs (all labs ordered are listed, but only abnormal results are displayed) Labs Reviewed  BASIC METABOLIC PANEL - Abnormal; Notable for the following components:      Result Value   Glucose, Bld 101 (*)    All other components within normal limits  URINALYSIS, ROUTINE W REFLEX MICROSCOPIC - Abnormal; Notable for the following components:   APPearance CLOUDY (*)    All other components within normal limits  CBC WITH DIFFERENTIAL/PLATELET  TROPONIN I  RAPID URINE DRUG SCREEN, HOSP PERFORMED  TSH  T4,  FREE  HIV ANTIBODY (ROUTINE TESTING)  HEMOGLOBIN A1C  LIPID PANEL  CBC  COMPREHENSIVE METABOLIC PANEL    EKG  EKG Interpretation  Date/Time:  Saturday February 16 2017 19:50:10 EST Ventricular Rate:  89 PR Interval:    QRS Duration: 127 QT Interval:  387 QTC Calculation: 471 R Axis:   80 Text Interpretation:  Atrial fibrillation Ventricular premature complex Nonspecific intraventricular conduction delay Probable anteroseptal infarct, old Borderline T abnormalities, inferior leads Confirmed by Tilden Fossa [phone]) on 02/16/2017 8:02:56 PM       Radiology Ct Head Wo Contrast  Result Date: 02/16/2017 CLINICAL DATA:  Headaches for 3 days. Left hand tingling. Left hand numbness. Hypertension and blurry vision. EXAM: CT HEAD WITHOUT CONTRAST TECHNIQUE: Contiguous axial images were obtained from the base of the skull through the vertex without intravenous contrast. COMPARISON:  None. FINDINGS: Brain: There is a small focal area of vague low-attenuation demonstrated in the right posterior parietal/ occipital region. This could represent early changes of acute ischemia. Consider MRI for further characterization if clinically indicated. No mass effect or midline shift. No abnormal extra-axial fluid collections. Gray-white matter junctions are distinct. Basal cisterns are not effaced. No acute intracranial hemorrhage. Ventricles are not dilated. Vascular: No hyperdense vessel or unexpected calcification. Skull: Normal. Negative for fracture or focal lesion. Sinuses/Orbits: No acute finding. Other: None. IMPRESSION: Vague low-attenuation focus demonstrated in the right posterior parietal/ occipital region possibly representing early changes of acute ischemia. Consider MRI for further characterization if clinically indicated. No acute intracranial hemorrhage or mass effect. Electronically Signed   By: Burman Nieves M.D.   On: 02/16/2017 19:21    Procedures Procedures (including critical care  time)  Medications Ordered in ED Medications  acetaminophen (TYLENOL) tablet 650 mg (not administered)    Or  acetaminophen (TYLENOL) solution 650 mg (not administered)    Or  acetaminophen (TYLENOL) suppository 650 mg (not administered)  enoxaparin (LOVENOX) injection 70 mg (70 mg Subcutaneous Given 02/17/17 0030)  ondansetron (ZOFRAN) injection 4 mg (4 mg Intravenous Given 02/17/17 0030)  propranolol (INDERAL) tablet 10 mg (not administered)  LORazepam (ATIVAN) injection 0.5 mg (not administered)  ibuprofen (ADVIL,MOTRIN) tablet 400 mg (400 mg Oral Given 02/16/17 1640)  sodium chloride 0.9 % bolus 500 mL (0 mLs Intravenous Stopped 02/16/17 2043)  metoCLOPramide (REGLAN) injection 10 mg (10 mg Intravenous Given 02/16/17 1839)  labetalol (NORMODYNE,TRANDATE) injection 10 mg (10 mg Intravenous Given 02/16/17 2109)   stroke: mapping our early stages of recovery book ( Does not apply Given 02/17/17 0031)     Initial Impression / Assessment and Plan / ED Course  I have reviewed the triage vital signs and the nursing notes.  Pertinent labs & imaging results that were available during my care of the patient were reviewed  by me and considered in my medical decision making (see chart for details).     Patient here for evaluation of right-sided headache, nausea, vomiting.  She did have tingling in her left hand prior to this starting but the tingling has now resolved.  She is neurologically intact in the emergency department.  CT head is concerning for acute ischemia.  EKG does demonstrate atrial fibrillation, patient with no prior history of this.  Discussed with patient recommendation for admission for workup of likely stroke and new onset atrial fibrillation.  Patient is in agreement with admission and plan.  Hospitalist consulted for admission.  Final Clinical Impressions(s) / ED Diagnoses   Final diagnoses:  Cerebrovascular accident (CVA), unspecified mechanism [facility](HCC)    ED Discharge Orders    None        Tilden Fossaees, Marylon Verno, MD 02/17/17 0041

## 2017-02-16 NOTE — Plan of Care (Addendum)
Discussed case with Dr. Madilyn Hookees patient [facility]. [name] is a [age] year old female with pmh HTN, hyperthyroidism, and arthritis; who presented with complaints of right sided pain HA with N/V x 3 day and paresthesias.  Initial vital signs revealed  patient to be afebrile, pulse is 78-87, respirations 18-20, blood pressure 158/93 - 174/107, and O2 saturations maintained on room air.  Lab workup was unremarkable.  EKG revealed new onset atrial fibrillation at 89 bpm.  Urinalysis was negative for any acute abnormalities.  CT scan of the brain showed a vague low-attenuation focus demonstrated in the right posterior parietal/ occipital region concerning for subacute stroke.  Neurology was not initially consulted, but will need to on arrival. Patient appears out of the window for any intervention.  Accepted to a telemetry bed as obs.  Will check Free T4 and Tsh.  Prior to transfer patient's heart rates went up as high as 130 and she was given 10 mg of labetalol.  Called by [facility] nurse advised the these changes while patient was in route to the hospital.

## 2017-02-17 ENCOUNTER — Observation Stay (HOSPITAL_BASED_OUTPATIENT_CLINIC_OR_DEPARTMENT_OTHER): Payer: Medicare Other

## 2017-02-17 ENCOUNTER — Observation Stay (HOSPITAL_COMMUNITY): Payer: Medicare Other

## 2017-02-17 DIAGNOSIS — I639 Cerebral infarction, unspecified: Secondary | ICD-10-CM

## 2017-02-17 DIAGNOSIS — R402252 Coma scale, best verbal response, oriented, at arrival to emergency department: Secondary | ICD-10-CM | POA: Diagnosis present

## 2017-02-17 DIAGNOSIS — I340 Nonrheumatic mitral (valve) insufficiency: Secondary | ICD-10-CM

## 2017-02-17 DIAGNOSIS — I6340 Cerebral infarction due to embolism of unspecified cerebral artery: Secondary | ICD-10-CM | POA: Diagnosis present

## 2017-02-17 DIAGNOSIS — Z79899 Other long term (current) drug therapy: Secondary | ICD-10-CM | POA: Diagnosis not present

## 2017-02-17 DIAGNOSIS — R402142 Coma scale, eyes open, spontaneous, at arrival to emergency department: Secondary | ICD-10-CM | POA: Diagnosis present

## 2017-02-17 DIAGNOSIS — R112 Nausea with vomiting, unspecified: Secondary | ICD-10-CM | POA: Diagnosis not present

## 2017-02-17 DIAGNOSIS — I4892 Unspecified atrial flutter: Secondary | ICD-10-CM | POA: Diagnosis present

## 2017-02-17 DIAGNOSIS — R292 Abnormal reflex: Secondary | ICD-10-CM | POA: Diagnosis present

## 2017-02-17 DIAGNOSIS — I4891 Unspecified atrial fibrillation: Secondary | ICD-10-CM | POA: Diagnosis present

## 2017-02-17 DIAGNOSIS — R402362 Coma scale, best motor response, obeys commands, at arrival to emergency department: Secondary | ICD-10-CM | POA: Diagnosis present

## 2017-02-17 DIAGNOSIS — E0590 Thyrotoxicosis, unspecified without thyrotoxic crisis or storm: Secondary | ICD-10-CM | POA: Diagnosis present

## 2017-02-17 DIAGNOSIS — R0602 Shortness of breath: Secondary | ICD-10-CM | POA: Diagnosis present

## 2017-02-17 DIAGNOSIS — I160 Hypertensive urgency: Secondary | ICD-10-CM | POA: Diagnosis present

## 2017-02-17 DIAGNOSIS — Z87891 Personal history of nicotine dependence: Secondary | ICD-10-CM | POA: Diagnosis not present

## 2017-02-17 DIAGNOSIS — I10 Essential (primary) hypertension: Secondary | ICD-10-CM | POA: Diagnosis present

## 2017-02-17 DIAGNOSIS — Z885 Allergy status to narcotic agent status: Secondary | ICD-10-CM | POA: Diagnosis not present

## 2017-02-17 LAB — COMPREHENSIVE METABOLIC PANEL
ALBUMIN: 3.7 g/dL (ref 3.5–5.0)
ALT: 26 U/L (ref 14–54)
AST: 29 U/L (ref 15–41)
Alkaline Phosphatase: 124 U/L (ref 38–126)
Anion gap: 11 (ref 5–15)
BUN: 6 mg/dL (ref 6–20)
CHLORIDE: 102 mmol/L (ref 101–111)
CO2: 20 mmol/L — AB (ref 22–32)
Calcium: 9.6 mg/dL (ref 8.9–10.3)
Creatinine, Ser: 0.51 mg/dL (ref 0.44–1.00)
GFR calc Af Amer: >60 mL/min (ref >60)
GFR calc non Af Amer: >60 mL/min (ref >60)
GLUCOSE: 109 mg/dL — AB (ref 65–99)
Potassium: 3.4 mmol/L — ABNORMAL LOW (ref 3.5–5.1)
SODIUM: 133 mmol/L — AB (ref 135–145)
Total Bilirubin: 1.3 mg/dL — ABNORMAL HIGH (ref 0.3–1.2)
Total Protein: 8.3 g/dL — ABNORMAL HIGH (ref 6.5–8.1)

## 2017-02-17 LAB — HEMOGLOBIN A1C
HEMOGLOBIN A1C: 5.8 % — AB (ref 4.8–5.6)
Mean Plasma Glucose: 119.76 mg/dL

## 2017-02-17 LAB — TSH

## 2017-02-17 LAB — ECHOCARDIOGRAM COMPLETE
HEIGHTINCHES: 62 in
Weight: 2536.17 oz

## 2017-02-17 LAB — CBC
HEMATOCRIT: 38.8 % (ref 36.0–46.0)
Hemoglobin: 12.8 g/dL (ref 12.0–15.0)
MCH: 26.4 pg (ref 26.0–34.0)
MCHC: 33 g/dL (ref 30.0–36.0)
MCV: 80 fL (ref 78.0–100.0)
PLATELETS: 300 10*3/uL (ref 150–400)
RBC: 4.85 MIL/uL (ref 3.87–5.11)
RDW: 14 % (ref 11.5–15.5)
WBC: 6.3 10*3/uL (ref 4.0–10.5)

## 2017-02-17 LAB — LIPID PANEL
Cholesterol: 70 mg/dL (ref 0–200)
HDL: 37 mg/dL — ABNORMAL LOW (ref >40)
LDL CALC: 22 mg/dL (ref 0–99)
TRIGLYCERIDES: 54 mg/dL (ref <150)
Total CHOL/HDL Ratio: 1.9 RATIO
VLDL: 11 mg/dL (ref 0–40)

## 2017-02-17 LAB — T4, FREE: FREE T4: 4.36 ng/dL — AB (ref 0.61–1.12)

## 2017-02-17 MED ORDER — ASPIRIN EC 81 MG PO TBEC: 81.0000 mg | DELAYED_RELEASE_TABLET | Freq: Every day | ORAL | Status: DC

## 2017-02-17 MED ORDER — PROPRANOLOL HCL 10 MG PO TABS
10.0000 mg | ORAL_TABLET | Freq: Two times a day (BID) | ORAL | Status: DC
  Administered 2017-02-17: 10 mg via ORAL
  Filled 2017-02-17 (×2): qty 1

## 2017-02-17 MED ORDER — ASPIRIN EC 81 MG PO TBEC
81.0000 mg | DELAYED_RELEASE_TABLET | Freq: Every day | ORAL | Status: DC
  Administered 2017-02-18: 81 mg via ORAL
  Filled 2017-02-17: qty 1

## 2017-02-17 MED ORDER — TRAMADOL HCL 50 MG PO TABS
50.0000 mg | ORAL_TABLET | Freq: Once | ORAL | Status: AC
  Administered 2017-02-17: 50 mg via ORAL
  Filled 2017-02-17: qty 1

## 2017-02-17 MED ORDER — ONDANSETRON HCL 4 MG/2ML IJ SOLN
4.0000 mg | Freq: Three times a day (TID) | INTRAMUSCULAR | Status: DC | PRN
  Administered 2017-02-17 – 2017-02-18 (×3): 4 mg via INTRAVENOUS
  Filled 2017-02-17 (×4): qty 2

## 2017-02-17 MED ORDER — PROPRANOLOL HCL 10 MG PO TABS
5.0000 mg | ORAL_TABLET | Freq: Two times a day (BID) | ORAL | Status: DC
  Filled 2017-02-17: qty 0.5

## 2017-02-17 MED ORDER — METOPROLOL TARTRATE 25 MG PO TABS
25.0000 mg | ORAL_TABLET | Freq: Two times a day (BID) | ORAL | Status: DC
  Administered 2017-02-17 – 2017-02-18 (×3): 25 mg via ORAL
  Filled 2017-02-17 (×3): qty 1

## 2017-02-17 MED ORDER — LORAZEPAM 2 MG/ML IJ SOLN
0.5000 mg | Freq: Once | INTRAMUSCULAR | Status: AC | PRN
  Administered 2017-02-17: 0.5 mg via INTRAVENOUS
  Filled 2017-02-17: qty 1

## 2017-02-17 MED ORDER — METHIMAZOLE 5 MG PO TABS
5.0000 mg | ORAL_TABLET | Freq: Every day | ORAL | Status: DC
  Administered 2017-02-17 – 2017-02-18 (×2): 5 mg via ORAL
  Filled 2017-02-17 (×2): qty 1

## 2017-02-17 MED ORDER — APIXABAN 5 MG PO TABS
5.0000 mg | ORAL_TABLET | Freq: Two times a day (BID) | ORAL | Status: DC
  Administered 2017-02-17 – 2017-02-18 (×2): 5 mg via ORAL
  Filled 2017-02-17 (×2): qty 1

## 2017-02-17 NOTE — Evaluation (Signed)
Physical Therapy Evaluation Patient Details Name: Evelyn Johnston MRN: [id number] DOB: [birth year] Today's Date: 02/17/2017   History of Present Illness  57 y.o. female who presented with a 3 day history of severe 9/10 throbbing right sided headache in conjunction with N/V and paresthesias. CT scan of the brain showed a vague low-attenuation focus demonstrated in the right posterior parietal/ occipital region concerning for subacute stroke.   Clinical Impression  Pt admitted with above diagnosis. Pt currently with functional limitations due to the deficits listed below (see PT Problem List). On eval, supervision provided for all functional mobility, including ambulation in room 25 feet without AD. Mobility limited by high blood pressure. Pt ambulated on RA with SpO2 96% and max HR 118. O2 replaced at end of session with pt in recliner. BP 193/119. RN notified.  Pt will benefit from skilled PT to increase their independence and safety with mobility to allow discharge to the venue listed below.  PT to follow acutely. No follow up services indicated.      Follow Up Recommendations No PT follow up;Supervision - Intermittent    Equipment Recommendations  None recommended by PT    Recommendations for Other Services       Precautions / Restrictions Precautions Precautions: Other (comment) Precaution Comments: watch BP      Mobility  Bed Mobility Overal bed mobility: Independent                Transfers Overall transfer level: Needs assistance Equipment used: None Transfers: Sit to/from Stand;Stand Pivot Transfers Sit to Stand: Supervision Stand pivot transfers: Supervision       General transfer comment: supervision for safety  Ambulation/Gait Ambulation/Gait assistance: Supervision Ambulation Distance (Feet): 25 Feet Assistive device: None Gait Pattern/deviations: Step-through pattern;Antalgic;Decreased stride length Gait velocity: decreased Gait velocity  interpretation: Below normal speed for age/gender General Gait Details: antalgic gait pattern. Pt reports due to arthritis and bursitis.  Stairs            Wheelchair Mobility    Modified Rankin (Stroke Patients Only) Modified Rankin (Stroke Patients Only) Pre-Morbid Rankin Score: No symptoms Modified Rankin: Slight disability     Balance Overall balance assessment: No apparent balance deficits (not formally assessed)                                           Pertinent Vitals/Pain Pain Assessment: No/denies pain    Home Living Family/patient expects to be discharged to:: Private residence Living Arrangements: Spouse/significant other;Children Available Help at Discharge: Family;Available 24 hours/day Type of Home: House Home Access: Stairs to enter   [employer] of Steps: 1 Home Layout: One level Home Equipment: Walker - 2 wheels;Cane - single point      Prior Function Level of Independence: Independent         Comments: not using ADs at time of admission. Drives. Does her own grocery shopping. Performs all IADLs.     Hand Dominance   Dominant Hand: Right    Extremity/Trunk Assessment   Upper Extremity Assessment Upper Extremity Assessment: Defer to OT evaluation    Lower Extremity Assessment Lower Extremity Assessment: Overall WFL for tasks assessed    Cervical / Trunk Assessment Cervical / Trunk Assessment: Normal  Communication   Communication: No difficulties  Cognition Arousal/Alertness: Awake/alert Behavior During Therapy: WFL for tasks assessed/performed Overall Cognitive Status: Within Functional Limits for tasks assessed  General Comments General comments (skin integrity, edema, etc.): Pt hypertensive throughout session.  BP 177/113 at beginning of session. Pt had just ambulated to the BR with NT. BP 193/119 at end of session. Max HR 118 during  ambulation.     Exercises     Assessment/Plan    PT Assessment Patient needs continued PT services  PT Problem List Decreased mobility;Decreased activity tolerance;Cardiopulmonary status limiting activity       PT Treatment Interventions Therapeutic activities;Gait training;Therapeutic exercise;Patient/family education;Stair training;Functional mobility training    PT Goals (Current goals can be found in the Care Plan section)  Acute Rehab PT Goals Patient Stated Goal: home PT Goal Formulation: With patient Time For Goal Achievement: 03/03/17 Potential to Achieve Goals: Good    Frequency Min 4X/week   Barriers to discharge        Co-evaluation               AM-PAC PT "6 Clicks" Daily Activity  Outcome Measure Difficulty turning over in bed (including adjusting bedclothes, sheets and blankets)?: None Difficulty moving from lying on back to sitting on the side of the bed? : None Difficulty sitting down on and standing up from a chair with arms (e.g., wheelchair, bedside commode, etc,.)?: None Help needed moving to and from a bed to chair (including a wheelchair)?: None Help needed walking in hospital room?: None Help needed climbing 3-5 steps with a railing? : A Little 6 Click Score: 23    End of Session   Activity Tolerance: Treatment limited secondary to medical complications (Comment)(HTN) Patient left: in chair;with call bell/phone within reach Nurse Communication: Other (comment);Mobility status(BP) PT Visit Diagnosis: Difficulty in walking, not elsewhere classified (R26.2)    Time: [id number] PT Time Calculation (min) (ACUTE ONLY): 14 min   Charges:   PT Evaluation $PT Eval Low Complexity: 1 Low     PT G Codes:        Aida RaiderWendy Delon Revelo, PT  Office # [phone] Pager [phone]   Ilda FoilGarrow, Romain Erion Rene 02/17/2017, 9:02 AM

## 2017-02-17 NOTE — Progress Notes (Signed)
OT Cancellation Note  Patient Details Name: Evelyn Johnston MRN: [id number] DOB: 10/25/1960   Cancelled Treatment:    Reason Eval/Treat Not Completed: Patient at procedure or test/ unavailable. Off unit for testing at this time. Will check back as able.   Doristine Sectionharity A Tailynn Armetta, MS OTR/L  Pager: [phone]   Natthew Marlatt A Deania Siguenza 02/17/2017, 11:26 AM

## 2017-02-17 NOTE — Progress Notes (Signed)
Patient still running high BP. Latest reading was 193/118, HR 118. Pt sitting in chair, stating she feels no pain or changes. Notified Samtani MD

## 2017-02-17 NOTE — Evaluation (Addendum)
Speech Language Pathology Evaluation Patient Details Name: Evelyn Johnston MRN: [id number] DOB: 1960-07-19 Today's Date: 02/17/2017 Time: 4098-1191 SLP Time Calculation (min) (ACUTE ONLY): 16 min  Problem List:  Patient Active Problem List   Diagnosis Date Noted  . Hyperthyroidism 02/17/2017  . Atrial fibrillation (HCC) 02/17/2017  . Nausea and vomiting 02/17/2017  . CVA (cerebral vascular accident) (HCC) 02/16/2017   Past Medical History:  Past Medical History:  Diagnosis Date  . Arthritis   . Hypertension   . Thyroid disease    Past Surgical History: History reviewed. No pertinent surgical history. HPI:  57 y.o. female who presented with a 3 day history of severe 9/10 throbbing right sided headache in conjunction with N/V and paresthesias. CT scan of the brain showed a vague low-attenuation focus demonstrated in the right posterior parietal/ occipital region concerning for subacute stroke.    Assessment / Plan / Recommendation Clinical Impression  Patient presents with higher level cognitive deficits characterized by impaired problem solving, reasoning, executive function. Pt states she is "good with math" typically, however made errors in simple math (2/5). Pt generally able to self-correct errors (clock drawing, simple subtraction, reasoning tasks) with min cues and extended time. Recommend f/u for further cognitive testing in outpatient setting. Educated pt re: potential deficits and impacts after a stroke, encouraged her to have supervision at least initially with medications and finances, avoid driving pending further assessment. Pt verbalizes understanding. Education completed; SLP will s/o.     SLP Assessment  SLP Recommendation/Assessment: All further Speech Lanaguage Pathology  needs can be addressed in the next venue of care SLP Visit Diagnosis: Frontal lobe and executive function deficit Frontal lobe and executive function deficit following: Cerebral infarction     Follow Up Recommendations  Outpatient SLP(Pt requests in [place])    Frequency and Duration           SLP Evaluation Cognition  Overall Cognitive Status: Impaired/Different from baseline Arousal/Alertness: Awake/alert Orientation Level: Oriented X4 Attention: Focused;Sustained;Selective Focused Attention: Appears intact Sustained Attention: Appears intact Selective Attention: Appears intact Memory: Impaired Memory Impairment: Other (comment)(delayed recall 3/4) Awareness: Appears intact Problem Solving: Impaired Problem Solving Impairment: Verbal complex;Functional complex Executive Function: Reasoning;Organizing Reasoning: Impaired Reasoning Impairment: Verbal complex;Functional complex Organizing: Impaired Organizing Impairment: Verbal complex;Functional complex(clock drawing impaired) Safety/Judgment: Appears intact       Comprehension  Auditory Comprehension Overall Auditory Comprehension: Appears within functional limits for tasks assessed Visual Recognition/Discrimination Discrimination: Within Function Limits Reading Comprehension Reading Status: Not tested    Expression Expression Primary Mode of Expression: Verbal Verbal Expression Overall Verbal Expression: Appears within functional limits for tasks assessed Initiation: No impairment Automatic Speech: Name;Social Response Level of Generative/Spontaneous Verbalization: Music therapist Expression Dominant Hand: Right Written Expression: Not tested   Oral / Motor  Oral Motor/Sensory Function Overall Oral Motor/Sensory Function: Within functional limits Motor Speech Overall Motor Speech: Appears within functional limits for tasks assessed Phonation: Hoarse;Low vocal intensity(pt reports this is her baseline) Articulation: Within functional limitis Intelligibility: Intelligible Motor Planning: Witnin functional limits   GO          Functional Assessment Tool Used: skilled clinical  judgment Functional Limitations: Memory Memory Current Status (Y7829): At least 1 percent but less than 20 percent impaired, limited or restricted Memory Goal Status (F6213): At least 1 percent but less than 20 percent impaired, limited or restricted Memory Discharge Status [id number]): At least 1 percent but less than 20 percent impaired, limited or restricted         [facility]  E Jarvin Ogren 02/17/2017, 9:52 AM  Rondel BatonMary Beth Cathey Fredenburg, MS, CCC-SLP Speech-Language Pathologist [phone]

## 2017-02-17 NOTE — Progress Notes (Signed)
ANTICOAGULATION CONSULT NOTE - Initial Consult  Pharmacy Consult for lovenox >>apixaban  Indication: atrial fibrillation  Allergies  Allergen Reactions  . Codeine Nausea And Vomiting  . Oxycontin [Oxycodone Hcl] Nausea And Vomiting    Patient Measurements: Height: 5\' 2"  (157.5 cm) Weight: 158 lb 8.2 oz (71.9 kg) IBW/kg (Calculated) : 50.1  Vital Signs: Temp: 98 F (36.7 C) (01/06 1303) Temp Source: Oral (01/06 1303) BP: 163/99 (01/06 1303) Pulse Rate: 65 (01/06 1303)  Labs: Recent Labs    02/16/17 1824 02/17/17 0628  HGB 12.3 12.8  HCT 36.9 38.8  PLT 274 300  CREATININE 0.62 0.51  TROPONINI <0.03  --     Medical History: Past Medical History:  Diagnosis Date  . Arthritis   . Hypertension   . Thyroid disease    Assessment: 57 yo F admitted with acute stroke and found to be on atrial fibrillation. MRI sx for two discrete areas of acute nonhemorrhagic infarction in right occipital lobe. Started on therapeutic Lovenox on admission but to transition to apixaban for further management. BMP and CBC stable.   Goal of Therapy:  Monitor platelets by anticoagulation protocol: Yes   Plan:  1. Start apixaban 5 mg BID starting at 22:00 tonight (12 hours after previous Lovenox dose given)   Pollyann SamplesAndy Belmira Daley, PharmD, BCPS 02/17/2017, 3:11 PM

## 2017-02-17 NOTE — Progress Notes (Signed)
Patient had still high BP after given propanolol. Reading were 148/104, 168/109 notified the on call MD response not receive.

## 2017-02-17 NOTE — Plan of Care (Signed)
Progressing

## 2017-02-17 NOTE — Progress Notes (Signed)
Patient received from ED via bed. Patient is alert and oriented. Vital signs is temp 97.8, BP 117/119, pulse 101.Md aware about vital signs. Skin assessment done with another nurse. Patient is on q2 vital signs and q2 hrs neuro check. Patient given stroke recovery book. Patient given instruction about call bell, phone and unit routine. Bed in low position and side rail up x2. Call bell in reach.

## 2017-02-17 NOTE — Progress Notes (Signed)
VASCULAR LAB PRELIMINARY  PRELIMINARY  PRELIMINARY  PRELIMINARY  Carotid duplex completed.    Preliminary report: 1-39% ICA plaquing. Vertebral artery flow is antegrade.   Dailyn Kempner, RVT 02/17/2017, 12:44 PM

## 2017-02-17 NOTE — Progress Notes (Signed)
Echocardiogram 2D Echocardiogram has been performed.  Pieter PartridgeBrooke S Zuly Belkin 02/17/2017, 12:36 PM

## 2017-02-17 NOTE — Progress Notes (Signed)
STROKE TEAM PROGRESS NOTE   HISTORY OF PRESENT ILLNESS (per record) Evelyn Johnston is an 57 y.o. female who presented with a 3 day history of severe 9/10 throbbing right sided headache in conjunction with N/V and paresthesias. On initial presentation to the hospital she was afebrile, pulse 78-87, respirations 18-20, blood pressure 158/93 -174/107, and O2 saturations maintained on room air. Lab workup was unremarkable. EKG revealed new onset atrial fibrillation at 89 bpm. Urinalysis was negative for any acute abnormalities.   CT scan of the brain showed a small crescentic cortically-based low-attenuation focus along the high convexity of the right parietal regionconcerning forsubacutestroke. The patient was out of the window for any intervention.   Her PMHx includes HTN, hyperthyroidism, and arthritis.    SUBJECTIVE (INTERVAL HISTORY)  She is sitting in chair, discussed stroke is likely from afib and she will need to start anticoagulation, discussed risks and answered all questions     FAMILY HISTORY No family history on file.  SOCIAL HISTORY  reports that she has quit smoking. Her smoking use included cigarettes. She smoked 0.50 packs per day. she has never used smokeless tobacco. She reports that she does not drink alcohol or use drugs.   Home Medications:  No outpatient medications have been marked as taking for the 02/16/17 encounter [facility] Encounter).      Hospital Medications:  . enoxaparin (LOVENOX) injection  70 mg Subcutaneous BID  . methimazole  5 mg Oral Daily  . metoprolol tartrate  25 mg Oral BID    OBJECTIVE Temp:  [97.8 F (36.6 C)-98.7 F (37.1 C)] 98 F (36.7 C) (01/06 1303) Pulse Rate:  [58-130] 65 (01/06 1303) Cardiac Rhythm: Atrial flutter (01/06 0702) Resp:  [17-23] 21 (01/06 1303) BP: (148-193)/(91-121) 163/99 (01/06 1303) SpO2:  [94 %-100 %] 97 % (01/06 1303) Weight:  [158 lb (71.7 kg)-159 lb 13.3 oz (72.5 kg)] 158 lb (71.7 kg) (01/06  1104)  CBC:  Recent Labs  Lab 02/16/17 1824 02/17/17 0628  WBC 6.6 6.3  NEUTROABS 2.4  --   HGB 12.3 12.8  HCT 36.9 38.8  MCV 79.5 80.0  PLT 274 300    Basic Metabolic Panel:  Recent Labs  Lab 02/16/17 1824 02/17/17 0628  NA 139 133*  K 4.0 3.4*  CL 107 102  CO2 24 20*  GLUCOSE 101* 109*  BUN 9 6  CREATININE 0.62 0.51  CALCIUM 9.3 9.6    Lipid Panel:     Component Value Date/Time   CHOL 70 02/17/2017 0628   TRIG 54 02/17/2017 0628   HDL 37 (L) 02/17/2017 0628   CHOLHDL 1.9 02/17/2017 0628   VLDL 11 02/17/2017 0628   LDLCALC 22 02/17/2017 0628   HgbA1c:  Lab Results  Component Value Date   HGBA1C 5.8 (H) 02/17/2017   Urine Drug Screen:     Component Value Date/Time   LABOPIA NONE DETECTED 02/16/2017 1935   COCAINSCRNUR NONE DETECTED 02/16/2017 1935   LABBENZ NONE DETECTED 02/16/2017 1935   AMPHETMU NONE DETECTED 02/16/2017 1935   THCU NONE DETECTED 02/16/2017 1935   LABBARB NONE DETECTED 02/16/2017 1935    Alcohol Level No results found for: ETH  IMAGING   Ct Head Wo Contrast 02/16/2017 IMPRESSION:  Vague low-attenuation focus demonstrated in the right posterior parietal/ occipital region possibly representing early changes of acute ischemia. No acute intracranial hemorrhage or mass effect.  Mr Evelyn Johnston Head Wo Contrast 02/17/2017 IMPRESSION:  Two discrete areas of acute nonhemorrhagic infarction affect the RIGHT occipital lobe. Given  the normal appearing intracranial MRA, and the peripheral location of the infarcts, the clinical impression of embolic infarction from atrial fibrillation is likely. Normal for age cerebral volume. Mild chronic microvascular ischemic change.    Dg Chest Port 1 View 02/17/2017 IMPRESSION:  No active disease.    Transthoracic Echocardiogram - EF 50%   Bilateral Carotid Dopplers  02/17/2017 Preliminary report: 1-39% ICA plaquing. Vertebral artery flow is antegrade.    PHYSICAL EXAM Vitals:   02/17/17 0500 02/17/17  0833 02/17/17 0927 02/17/17 1303  BP:  (!) 177/113 (!) 193/118 (!) 163/99  Pulse:  87  65  Resp:  (!) 22  (!) 21  Temp:  98.3 F (36.8 C)  98 F (36.7 C)  TempSrc:  Oral  Oral  SpO2:  100%  97%  Weight: 158 lb 8.2 oz (71.9 kg)     Height:         Past Medical History:  Diagnosis Date  . Arthritis   . Hypertension   . Thyroid disease     PHYSICAL EXAM Physical exam: Exam: Gen: NAD Eyes: anicteric sclerae, moist conjunctivae                    CV: no MRG, no carotid bruits, no peripheral edema Mental Status: Alert, follows commands, good historian  Neuro: Detailed Neurologic Exam  Speech:    No aphasia, no dysarthria  Cranial Nerves:    The pupils are equal, round, and reactive to light.. Attempted, Fundi not visualized.  EOMI. No gaze preference. Visual fields full. Face symmetric, Tongue midline. Hearing intact to voice. Shoulder shrug intact  Motor Observation:    no involuntary movements noted. Tone appears normal.     Strength:    5/5     Sensation:  Intact to LT  Plantars downgoing.   ASSESSMENT/PLAN [name] is a 57 y.o. female with history of arthritis, hypertension, and thyroid disease presenting with throbbing headache, nausea vomiting, and paresthesias in conjunction with new onset atrial fibrillation. She did not receive IV t-PA due to late presentation.   Stroke:  Two occipital lobe infarcts - embolic secondary to atrial fibrillation.  Resultant  resolution  CT head - Vague low-attenuation focus demonstrated in the right posterior parietal/ occipital region.  MRI head - Two discrete areas of acute nonhemorrhagic infarction Rt occipital lobe.  MRA head - normal  Carotid Doppler - 1-39% ICA plaquing. Vertebral artery flow is antegrade.  2D Echo - EF 50%   LDL - 22  HgbA1c - 5.8  VTE prophylaxis - Lovenox Diet Heart Room service appropriate? Yes; Fluid consistency: Thin  No antithrombotic prior to admission, now on aspirin  81 mg daily recommend Eliquis for afib  Patient counseled to be compliant with her antithrombotic medications  Ongoing aggressive stroke risk factor management  Therapy recommendations:  No follow-up PT recommended. OT evaluation is pending.  Disposition:  Pending  Hypertension  Blood pressure tends to run high. Lotensin 10 mg daily PTA. Now on metoprolol for rate control.  Permissive hypertension (OK if < 220/120) but gradually normalize in 5-7 days  Long-term BP goal normotensive  Hyperlipidemia  Home meds: No lipid lowering medications prior to admission  LDL 22, goal < 70    Other Stroke Risk Factors  Former cigarette smoker - quit  Obesity, Body mass index is 28.99 kg/m., recommend weight loss, diet and exercise as appropriate   Atrial fibrillation  Other Active Problems  Hypokalemia  New onset atrial fibrillation  Plan / Recommendations   Stroke workup  Consult pharmacy to initiate Eliquis therapy  Consult Care management for Elliquis  Await 2 D echo  Supplement K  Permissive hypertension (OK if < 220/120) but gradually normalize in 5-7 days  Stroke will sign off at this time   Hospital day # 0  Stroke will sign off   Personally examined patient and images, and have participated in and made any corrections needed to history, physical, neuro exam,assessment and plan as stated above.  I have personally obtained the history, evaluated lab date, reviewed imaging studies and agree with radiology interpretations.    Naomie Dean, MD Stroke Neurology [phone]  To contact Stroke Continuity provider, please refer to [email or web address]. After hours, contact General Neurology

## 2017-02-17 NOTE — Evaluation (Signed)
Occupational Therapy Evaluation Patient Details Name: Evelyn AbbeJacqueline Johnston MRN: [id number] DOB: 09/02/1960 Today's Date: 02/17/2017    History of Present Illness 57 y.o. female who presented with a 3 day history of severe 9/10 throbbing right sided headache in conjunction with N/V and paresthesias. CT scan of the brain showed a vague low-attenuation focus demonstrated in the right posterior parietal/occipital region concerning for subacute stroke. MRI brain revealed two discrete areas of acute nonhemorrhagic infarction affecting the R occipital lobe.    Clinical Impression   PTA, pt was able to complete ADL, IADL, and functional mobility with only intermittent use of cane and no assistance from family. She drives and maintains an active lifestyle. She currently requires min guard assist for stability with functional mobility ambulating in her room and supervision for safety with LB ADL. Pt presents with blurred vision especially in the L visual field and reports this has been improving since she arrived to the hospital. Concern for visual deficits to impact her ability to safely navigate her home and community and complete IADL. Pt would benefit from continued OT services while admitted to improve independence and safety with ADL as well as functional use of vision prior to returning home with intermittent assistance from her family. Recommended that pt follow-up with her opthamologist and stop driving until cleared by MD and she verbalizes understanding.    Follow Up Recommendations  No OT follow up;Supervision - Intermittent    Equipment Recommendations  3 in 1 bedside commode    Recommendations for Other Services       Precautions / Restrictions Precautions Precautions: Other (comment) Precaution Comments: watch BP Restrictions Weight Bearing Restrictions: No      Mobility Bed Mobility Overal bed mobility: Independent                Transfers Overall transfer level: Needs  assistance Equipment used: None Transfers: Sit to/from Stand Sit to Stand: Min guard         General transfer comment: min guard assist for safety    Balance Overall balance assessment: No apparent balance deficits (not formally assessed)                                         ADL either performed or assessed with clinical judgement   ADL Overall ADL's : Needs assistance/impaired Eating/Feeding: Set up;Sitting   Grooming: Supervision/safety;Standing   Upper Body Bathing: Set up;Sitting   Lower Body Bathing: Supervison/ safety;Sit to/from stand   Upper Body Dressing : Set up;Sitting   Lower Body Dressing: Supervision/safety;Sit to/from stand   Toilet Transfer: Min guard;Ambulation Toilet Transfer Details (indicate cue type and reason): simulated in room with sit<>stand followed by ambulation in room.  Toileting- ArchitectClothing Manipulation and Hygiene: Min guard;Sit to/from stand       Functional mobility during ADLs: Min guard General ADL Comments: Pt able to complete ambulation to sink and participate in fine motor and standing grooming tasks with min guard assist for balance. She reports that her ambulation feels at baseline.      Vision Baseline Vision/History: Wears glasses Wears Glasses: At all times Patient Visual Report: Blurring of vision(resolving) Vision Assessment?: Yes Eye Alignment: Within Functional Limits Ocular Range of Motion: Within Functional Limits Alignment/Gaze Preference: Within Defined Limits Tracking/Visual Pursuits: Able to track stimulus in all quads without difficulty Saccades: Within functional limits Convergence: Within functional limits Visual Fields: No apparent deficits Additional  Comments: Fields appear in tact. However, pt reports blurring of vision to the L and concern for difficulty with vision in cluttered environment to the L.      Perception     Praxis      Pertinent Vitals/Pain Pain Assessment: No/denies  pain     Hand Dominance Right   Extremity/Trunk Assessment Upper Extremity Assessment Upper Extremity Assessment: Overall WFL for tasks assessed(L index finger burning has resolved)   Lower Extremity Assessment Lower Extremity Assessment: Overall WFL for tasks assessed   Cervical / Trunk Assessment Cervical / Trunk Assessment: Normal   Communication Communication Communication: No difficulties   Cognition Arousal/Alertness: Awake/alert Behavior During Therapy: WFL for tasks assessed/performed Overall Cognitive Status: Impaired/Different from baseline Area of Impairment: Problem solving;Awareness                           Awareness: Emergent Problem Solving: Slow processing General Comments: Some difficulty anticipating and generalizing deficits to functional tasks.    General Comments  Pt hypertensive throughout session (RN aware and notified again after session). 162/103 at beginning of session and 167/104 after session.     Exercises     Shoulder Instructions      Home Living Family/patient expects to be discharged to:: Private residence Living Arrangements: Spouse/significant other;Children Available Help at Discharge: Family;Available 24 hours/day Type of Home: House Home Access: Stairs to enter [employer] of Steps: 1   Home Layout: One level     Bathroom Shower/Tub: Tub/shower unit(need to verify this)   Bathroom Toilet: Standard Bathroom Accessibility: Yes   Home Equipment: Environmental consultant - 2 wheels;Gilmer Mor - single point      Lives With: Spouse    Prior Functioning/Environment Level of Independence: Independent;Independent with assistive device(s)        Comments: Occasionally uses cane (has bursitis and arthritis of her hip). Drives and independent with IADL.         OT Problem List: Decreased strength;Decreased range of motion;Decreased activity tolerance;Decreased safety awareness;Decreased knowledge of use of DME or AE;Decreased  knowledge of precautions      OT Treatment/Interventions: Self-care/ADL training;Visual/perceptual remediation/compensation    OT Goals(Current goals can be found in the care plan section) Acute Rehab OT Goals Patient Stated Goal: home OT Goal Formulation: With patient Time For Goal Achievement: 03/03/17 Potential to Achieve Goals: Good ADL Goals Pt Will Perform Tub/Shower Transfer: with modified independence;Tub transfer;ambulating;3 in 1 Additional ADL Goal #1: Pt will verbalize 2 compensatory strategies to maximize functional use of vision during morning ADL routine.  OT Frequency: Min 2X/week   Barriers to D/C:            Co-evaluation              AM-PAC PT "6 Clicks" Daily Activity     Outcome Measure Help from another person eating meals?: None Help from another person taking care of personal grooming?: A Little Help from another person toileting, which includes using toliet, bedpan, or urinal?: A Little Help from another person bathing (including washing, rinsing, drying)?: A Little Help from another person to put on and taking off regular upper body clothing?: None Help from another person to put on and taking off regular lower body clothing?: A Little 6 Click Score: 20   End of Session Nurse Communication: Mobility status  Activity Tolerance: Patient tolerated treatment well Patient left: in chair;with call bell/phone within reach;with family/visitor present  OT Visit Diagnosis: Low vision, both eyes (H54.2)  Time: 1610-9604 OT Time Calculation (min): 19 min Charges:  OT General Charges $OT Visit: 1 Visit OT Evaluation $OT Eval Moderate Complexity: 1 Mod G-Codes:    Doristine Section, MS OTR/L  Pager: [phone]  Nyree Applegate A Hitomi Slape 02/17/2017, 4:13 PM

## 2017-02-17 NOTE — Progress Notes (Signed)
Pt notified RN at 1700 of the worse headache and returning of left index finger numbness. Reassessed pt, shows no weakness in left side. Ambulated to the bathroom. Gave pt tylenol and notified Samtani, MD of pt's symptoms. MD gave verbal order for one time dose of Tramdol 50mg . If pt's headache is unresolved after 7pm to call the night on-call MD and have another head CT ordered. Will continue to monitor pt.

## 2017-02-17 NOTE — Progress Notes (Signed)
Hospitalist progress note   Evelyn Johnston  WGN:[id number] DOB: [birth year] DOA: 02/16/2017 PCP: [facility]   Specialists:   Brief Narrative:  57 year old female, HTN, hyperthyroidism, tinea cruris, perimenopausal, prior smoker bilateral hip pain admit from home with 9/10 Ha lasting 3 days-EKG showed new onset atrial fibrillation and CT scan of brain showed low-attenuation focus in the right parietal region concerning for subacute stroke patient was outside window for intervention  Assessment & Plan:   Assessment:  The primary encounter diagnosis was Cerebrovascular accident (CVA), unspecified mechanism (HCC). A diagnosis of A-fib [facility]) was also pertinent to this visit.  New stroke-risk stratification as per neurology-complete workup New onset atrial fibrillation-looks more like 4:1 flutter await echocardiogram --not rate controlled-changing from propanalol to metoprolol 25 bid telemetry-suspect we will need to use anticoagulation with Eliquis or Xarelto going for lifelong given recent stroke-currently on Lovenox  HTN-poorly controlled-see above  Hyperthyroidism/Graves dx-ran out of methimazole 2 wks prior-restart later today-TSH is less than 0.  01 T4 is 1.3 we will restart methimazole and monitor-I suspect her uncontrolled heart rate may be coming from her hyperthyroid state    DVT prophylaxis: Lovenox therapeutic dosing code Status:   Full code   family Communication:   None present Disposition Plan: Inpatient pending workup and rate control   Consultants:   Neurology  Procedures:   Multiple  Antimicrobials:   None  Subjective: Awake alert pleasant Headache mildy better al;so states that L index finger tingling No dsitress no cp no vomit Couldn't eat much  Objective: Vitals:   02/17/17 0047 02/17/17 0247 02/17/17 0248 02/17/17 0500  BP: (!) 171/91 (!) 148/104    Pulse: 70 (!) 58    Resp:  (!) 22    Temp:   98.7 F (37.1 C)   TempSrc:   Oral    SpO2: 94% 94%    Weight:    71.9 kg (158 lb 8.2 oz)  Height:        Intake/Output Summary (Last 24 hours) at 02/17/2017 0745 Last data filed at 02/16/2017 2043 Gross per 24 hour  Intake 500 ml  Output -  Net 500 ml   Filed Weights   02/16/17 1426 02/16/17 2237 02/17/17 0500  Weight: 73.5 kg (162 lb) 72.5 kg (159 lb 13.3 oz) 71.9 kg (158 lb 8.2 oz)    Examination: eomi ncat in nad s1 s 2eg irreg,  s1 s 2no m abd soft finger nose finger intact power 5/5/, reflexes 2/3 in knee, Brachio No rash No Le edema  Data Reviewed: I have personally reviewed following labs and imaging studies  CBC: Recent Labs  Lab 02/16/17 1824  WBC 6.6  NEUTROABS 2.4  HGB 12.3  HCT 36.9  MCV 79.5  PLT 274   Basic Metabolic Panel: Recent Labs  Lab 02/16/17 1824  NA 139  K 4.0  CL 107  CO2 24  GLUCOSE 101*  BUN 9  CREATININE 0.62  CALCIUM 9.3   GFR: Estimated Creatinine Clearance: 73.8 mL/min (by C-G formula based on SCr of 0.62 mg/dL). Liver Function Tests: No results for input(s): AST, ALT, ALKPHOS, BILITOT, PROT, ALBUMIN in the last 168 hours. No results for input(s): LIPASE, AMYLASE in the last 168 hours. No results for input(s): AMMONIA in the last 168 hours. Coagulation Profile: No results for input(s): INR, PROTIME in the last 168 hours. Cardiac Enzymes: Recent Labs  Lab 02/16/17 1824  TROPONINI <0.03   CBG: No results for input(s): GLUCAP in the last 168 hours.  Urine analysis:    Component Value Date/Time   COLORURINE YELLOW 02/16/2017 1935   APPEARANCEUR CLOUDY (A) 02/16/2017 1935   LABSPEC 1.025 02/16/2017 1935   PHURINE 6.5 02/16/2017 1935   GLUCOSEU NEGATIVE 02/16/2017 1935   HGBUR NEGATIVE 02/16/2017 1935   BILIRUBINUR NEGATIVE 02/16/2017 1935   KETONESUR NEGATIVE 02/16/2017 1935   PROTEINUR NEGATIVE 02/16/2017 1935   NITRITE NEGATIVE 02/16/2017 1935   LEUKOCYTESUR NEGATIVE 02/16/2017 1935     Radiology Studies: Reviewed images personally in health  database    Scheduled Meds: . enoxaparin (LOVENOX) injection  70 mg Subcutaneous BID  . propranolol  5 mg Oral BID   Continuous Infusions:   LOS: 0 days    Time spent: 5825    Pleas KochJai Rajesh Wyss, MD [facility] (P) [phone]   If 7PM-7AM, please contact night-coverage [email or web address] Password [email or web address] 02/17/2017, 7:45 AM

## 2017-02-18 LAB — CBC WITH DIFFERENTIAL/PLATELET
Basophils Absolute: 0 10*3/uL (ref 0.0–0.1)
Basophils Relative: 0 %
EOS PCT: 2 %
Eosinophils Absolute: 0.1 10*3/uL (ref 0.0–0.7)
HEMATOCRIT: 37.4 % (ref 36.0–46.0)
Hemoglobin: 12.5 g/dL (ref 12.0–15.0)
LYMPHS PCT: 44 %
Lymphs Abs: 2.1 10*3/uL (ref 0.7–4.0)
MCH: 27.1 pg (ref 26.0–34.0)
MCHC: 33.4 g/dL (ref 30.0–36.0)
MCV: 81 fL (ref 78.0–100.0)
MONO ABS: 0.5 10*3/uL (ref 0.1–1.0)
MONOS PCT: 10 %
NEUTROS ABS: 2.1 10*3/uL (ref 1.7–7.7)
Neutrophils Relative %: 44 %
PLATELETS: 271 10*3/uL (ref 150–400)
RBC: 4.62 MIL/uL (ref 3.87–5.11)
RDW: 14.4 % (ref 11.5–15.5)
WBC: 4.7 10*3/uL (ref 4.0–10.5)

## 2017-02-18 LAB — BASIC METABOLIC PANEL
ANION GAP: 9 (ref 5–15)
BUN: 11 mg/dL (ref 6–20)
CO2: 24 mmol/L (ref 22–32)
Calcium: 9.3 mg/dL (ref 8.9–10.3)
Chloride: 104 mmol/L (ref 101–111)
Creatinine, Ser: 0.58 mg/dL (ref 0.44–1.00)
GFR calc Af Amer: >60 mL/min (ref >60)
GFR calc non Af Amer: >60 mL/min (ref >60)
GLUCOSE: 115 mg/dL — AB (ref 65–99)
POTASSIUM: 3.8 mmol/L (ref 3.5–5.1)
Sodium: 137 mmol/L (ref 135–145)

## 2017-02-18 LAB — HIV ANTIBODY (ROUTINE TESTING W REFLEX): HIV Screen 4th Generation wRfx: NONREACTIVE

## 2017-02-18 MED ORDER — ATORVASTATIN CALCIUM 40 MG PO TABS: 40.0000 mg | ORAL_TABLET | Freq: Every day | ORAL | 11 refills | Status: AC

## 2017-02-18 MED ORDER — ATORVASTATIN CALCIUM 40 MG PO TABS: 40.0000 mg | ORAL_TABLET | Freq: Every day | ORAL | Status: DC

## 2017-02-18 MED ORDER — METHIMAZOLE 10 MG PO TABS: 10.0000 mg | ORAL_TABLET | Freq: Every day | ORAL | 11 refills | Status: AC

## 2017-02-18 MED ORDER — METOPROLOL TARTRATE 25 MG PO TABS: 25.0000 mg | ORAL_TABLET | Freq: Two times a day (BID) | ORAL | 11 refills | Status: AC

## 2017-02-18 MED ORDER — APIXABAN 5 MG PO TABS: 5.0000 mg | ORAL_TABLET | Freq: Two times a day (BID) | ORAL | 0 refills | Status: AC

## 2017-02-18 NOTE — Progress Notes (Signed)
Occupational Therapy Treatment Patient Details Name: Trishelle Devora MRN: [id number] DOB: [birth year] Today's Date: 02/18/2017    History of present illness 57 y.o. female who presented with a 3 day history of severe 9/10 throbbing right sided headache in conjunction with N/V and paresthesias. CT scan of the brain showed a vague low-attenuation focus demonstrated in the right posterior parietal/occipital region concerning for subacute stroke. MRI brain revealed two discrete areas of acute nonhemorrhagic infarction affecting the R occipital lobe.    OT comments  Pt demonstrated difficulty identifying light stimulus in L field. Educated on compensatory strategies for visual changes. Continue to recommend pt follow up with her eye doctor to have a full field visual assessment completed. Until that time, pt should refrain from driving. Pt verbalized understanding.   Follow Up Recommendations  No OT follow up;Supervision - Intermittent    Equipment Recommendations  None recommended by OT    Recommendations for Other Services      Precautions / Restrictions Precautions Precautions: None Restrictions Weight Bearing Restrictions: No       Mobility Bed Mobility Overal bed mobility: Independent             General bed mobility comments: Sitting at EOB upon entry.   Transfers Overall transfer level: Independent Equipment used: None Transfers: Sit to/from Stand Sit to Stand: Min guard         General transfer comment: Some unsteadiness noted, and required min guard for safety.     Balance Overall balance assessment: No apparent balance deficits (not formally assessed) Sitting-balance support: No upper extremity supported;Feet supported Sitting balance-Leahy Scale: Good     Standing balance support: Bilateral upper extremity supported;No upper extremity supported;During functional activity Standing balance-Leahy Scale: Fair Standing balance comment: Able to maintain static  standing without UE support.                            ADL either performed or assessed with clinical judgement   ADL Overall ADL's : At baseline                                             Vision   Visual Fields: Left visual field deficit Additional Comments: Pt not identifying light stimulus in L field. Pt states vision has gottn better but " not normal"(continued education on environmental/activity modifications )   Perception     Praxis      Cognition Arousal/Alertness: Awake/alert Behavior During Therapy: WFL for tasks assessed/performed Overall Cognitive Status: No family/caregiver present to determine baseline cognitive functioning Area of Impairment: Problem solving;Awareness                           Awareness: Emergent Problem Solving: Slow processing          Exercises Exercises: General Lower Extremity General Exercises - Lower Extremity Long Arc Quad: AROM;Both;10 reps;Seated Hip Flexion/Marching: AROM;Both;10 reps;Seated   Shoulder Instructions       General Comments HR ranged from mid low 90s to mid 100s. BP stable.     Pertinent Vitals/ Pain       Pain Assessment: No/denies pain Faces Pain Scale: Hurts little more Pain Location: R hip  Pain Descriptors / Indicators: Aching Pain Intervention(s): Limited activity within patient's tolerance;Monitored during session;Repositioned  Home Living  Prior Functioning/Environment              Frequency  Min 2X/week        Progress Toward Goals  OT Goals(current goals can now be found in the care plan section)  Progress towards OT goals: Goals met/education completed, patient discharged from OT  Acute Rehab OT Goals Patient Stated Goal: home OT Goal Formulation: With patient Time For Goal Achievement: 03/03/17 Potential to Achieve Goals: Good ADL Goals Pt Will Perform Tub/Shower Transfer:  with modified independence;Tub transfer;ambulating;3 in 1 Additional ADL Goal #1: Pt will verbalize 2 compensatory strategies to maximize functional use of vision during morning ADL routine.  Plan Discharge plan remains appropriate    Co-evaluation                 AM-PAC PT "6 Clicks" Daily Activity     Outcome Measure   Help from another person eating meals?: None Help from another person taking care of personal grooming?: None Help from another person toileting, which includes using toliet, bedpan, or urinal?: None Help from another person bathing (including washing, rinsing, drying)?: None Help from another person to put on and taking off regular upper body clothing?: None Help from another person to put on and taking off regular lower body clothing?: None 6 Click Score: 24    End of Session    OT Visit Diagnosis: Low vision, both eyes (H54.2)   Activity Tolerance Patient tolerated treatment well   Patient Left in chair;with call bell/phone within reach   Nurse Communication Mobility status        Time: 2919-1660 OT Time Calculation (min): 10 min  Charges: OT General Charges $OT Visit: 1 Visit OT Treatments $Therapeutic Activity: 8-22 mins  [facility], OT/L  [phone] 02/18/2017   Ilithyia Titzer,HILLARY 02/18/2017, 2:01 PM

## 2017-02-18 NOTE — Discharge Instructions (Addendum)

## 2017-02-18 NOTE — Progress Notes (Signed)
Physical Therapy Treatment Patient Details Name: Evelyn Johnston MRN: [id number] DOB: 08/01/1960 Today's Date: 02/18/2017    History of Present Illness 57 y.o. female who presented with a 3 day history of severe 9/10 throbbing right sided headache in conjunction with N/V and paresthesias. CT scan of the brain showed a vague low-attenuation focus demonstrated in the right posterior parietal/occipital region concerning for subacute stroke. MRI brain revealed two discrete areas of acute nonhemorrhagic infarction affecting the R occipital lobe.     PT Comments    Pt with improved gait tolerance this session and able to increase distance. Continues to present with antalgic gait and decreased steadiness without use of AD. Educated about using RW when pain is increased in R hip to increase steadiness. Pt reports she is going to go see a doctor for her hip when things resolve to discuss options about pain management in her R hip. Current recommendations appropriate. Will continue to follow acutely to maximize functional mobility independence and safety.    Follow Up Recommendations  No PT follow up;Supervision - Intermittent     Equipment Recommendations  None recommended by PT    Recommendations for Other Services       Precautions / Restrictions Precautions Precautions: None Restrictions Weight Bearing Restrictions: No    Mobility  Bed Mobility               General bed mobility comments: Sitting at EOB upon entry.   Transfers Overall transfer level: Needs assistance Equipment used: None Transfers: Sit to/from Stand Sit to Stand: Min guard         General transfer comment: Some unsteadiness noted, and required min guard for safety.   Ambulation/Gait Ambulation/Gait assistance: Min guard;Supervision Ambulation Distance (Feet): 125 Feet Assistive device: None;Rolling walker (2 wheeled) Gait Pattern/deviations: Step-to pattern;Step-through pattern;Decreased stride  length;Antalgic Gait velocity: Decreased  Gait velocity interpretation: Below normal speed for age/gender General Gait Details: Slow, antalgic gait secondary to R hip pain at baseline. Attempted without AD, and pt reaching for furniture in room and required min guard for steadying assist. Pt improved steadiness with use of RW. Educated to use at home.    Stairs            Wheelchair Mobility    Modified Rankin (Stroke Patients Only)       Balance Overall balance assessment: Needs assistance Sitting-balance support: No upper extremity supported;Feet supported Sitting balance-Leahy Scale: Good     Standing balance support: Bilateral upper extremity supported;No upper extremity supported;During functional activity Standing balance-Leahy Scale: Fair Standing balance comment: Able to maintain static standing without UE support.                             Cognition Arousal/Alertness: Awake/alert Behavior During Therapy: WFL for tasks assessed/performed Overall Cognitive Status: Impaired/Different from baseline Area of Impairment: Problem solving;Awareness                           Awareness: Emergent Problem Solving: Slow processing        Exercises General Exercises - Lower Extremity Long Arc Quad: AROM;Both;10 reps;Seated Hip Flexion/Marching: AROM;Both;10 reps;Seated    General Comments General comments (skin integrity, edema, etc.): HR ranged from mid low 90s to mid 100s. BP stable.       Pertinent Vitals/Pain Pain Assessment: Faces Faces Pain Scale: Hurts little more Pain Location: R hip  Pain Descriptors / Indicators: Aching Pain  Intervention(s): Limited activity within patient's tolerance;Monitored during session;Repositioned    Home Living                      Prior Function            PT Goals (current goals can now be found in the care plan section) Acute Rehab PT Goals Patient Stated Goal: home PT Goal  Formulation: With patient Time For Goal Achievement: 03/03/17 Potential to Achieve Goals: Good Progress towards PT goals: Progressing toward goals    Frequency    Min 4X/week      PT Plan Current plan remains appropriate    Co-evaluation              AM-PAC PT "6 Clicks" Daily Activity  Outcome Measure  Difficulty turning over in bed (including adjusting bedclothes, sheets and blankets)?: None Difficulty moving from lying on back to sitting on the side of the bed? : None Difficulty sitting down on and standing up from a chair with arms (e.g., wheelchair, bedside commode, etc,.)?: Unable Help needed moving to and from a bed to chair (including a wheelchair)?: A Little Help needed walking in hospital room?: A Little Help needed climbing 3-5 steps with a railing? : A Little 6 Click Score: 18    End of Session Equipment Utilized During Treatment: Gait belt Activity Tolerance: Patient tolerated treatment well Patient left: in bed;with call bell/phone within reach Nurse Communication: Mobility status PT Visit Diagnosis: Difficulty in walking, not elsewhere classified (R26.2)     Time: 1610-9604 PT Time Calculation (min) (ACUTE ONLY): 18 min  Charges:  $Gait Training: 8-22 mins                    G Codes:       Gladys Damme, PT, DPT  Acute Rehabilitation Services  Pager: [phone]    Lehman Prom 02/18/2017, 11:33 AM

## 2017-02-18 NOTE — Care Management Note (Addendum)
Case Management Note  Patient Details  Name: Evelyn Johnston MRN: [id number] Date of Birth: 01/21/1961  Subjective/Objective:    Stroke                Action/Plan: Discharge Planning: NCM spoke to pt and states she does not know the name of Rx plan. Will need to check benefits for Eliquis. Will provided pt with a 30 day free trial card for Eliquis. Will have attending escribe Rx to her pharmacy. Her pharmacy has in stock. Her copay is $20.  [facility]  Expected Discharge Date:                  Expected Discharge Plan:  Home/Self Care  In-House Referral:  NA  Discharge planning Services  CM Consult  Post Acute Care Choice:  NA Choice offered to:  NA  DME Arranged:  N/A DME Agency:  NA  HH Arranged:  NA HH Agency:  NA  Status of Service:  Completed, signed off  If discussed at Long Length of Stay Meetings, dates discussed:    Additional Comments:  Elliot CousinShavis, Aydan Phoenix Ellen, RN 02/18/2017, 11:14 AM

## 2017-02-18 NOTE — Plan of Care (Signed)
Progressing

## 2017-02-18 NOTE — Progress Notes (Signed)
Pt given discharge instructions, prescriptions, and care notes. Pt verbalized understanding AEB no further questions or concerns at this time. IV was discontinued, no redness, pain, or swelling noted at this time. Telemetry discontinued and Centralized Telemetry was notified. Pt to leave the floor via wheelchair with staff in stable condition. 

## 2017-02-18 NOTE — Discharge Summary (Signed)
Physician Discharge Summary  Evelyn Johnston ZOX:[id number] DOB: 1961/01/18 DOA: 02/16/2017  PCP: [facility]  Admit date: 02/16/2017 Discharge date: 02/18/2017  Time spent: 35 minutes  Recommendations for Outpatient Follow-up:  1. Started metoprolol this admit 25 bid-stopped amlodipine/lisinopril 2. Needs tsh and t3/t4 1 mo and OP follow up with endocrine 3. Started elliquis this admit-needs this life-long going forward-consider cardiology input 4. Need OP stroke Md follow-up  Discharge Diagnoses:  Principal Problem:   CVA (cerebral vascular accident) [facility]) Active Problems:   Hyperthyroidism   Atrial fibrillation (HCC)   Nausea and vomiting   Discharge Condition: imporved  Diet recommendation:  hh low salt  Filed Weights   02/16/17 2237 02/17/17 0500 02/18/17 0416  Weight: 72.5 kg (159 lb 13.3 oz) 71.9 kg (158 lb 8.2 oz) 72.6 kg (160 lb 0.9 oz)    History of present illness:  57 year old female, HTN, hyperthyroidism, tinea cruris, perimenopausal, prior smoker bilateral hip pain admit from home with 9/10 Ha lasting 3 days-EKG showed new onset atrial fibrillation and CT scan of brain showed low-attenuation focus in the right parietal region concerning for subacute stroke patient was outside window for intervention    Hospital Course:  New stroke-risk stratification as per neurology-complete workup New onset atrial fibrillation-looks more like 4:1 flutter, echo EF 50% --not rate -on d/c given metoprolol 25 bid  Eliquis lifelong given recent stroke  HTN-poorly controlled-see above  Hyperthyroidism/[facility] dx-ran out of methimazole 2 wks prior-restart later today-TSH is less than 0.  01 T4 is 1.3 we will restart methimazole and monitor-I suspect her uncontrolled heart rate may be coming from her hyperthyroid state    Procedures: ecno carotids mri  Consultations:  neurology  Discharge Exam: Vitals:   02/18/17 0729 02/18/17 0839  BP:  139/81  Pulse:   74  Resp:  20  Temp: (!) 97.5 F (36.4 C)   SpO2:  96%    General: eomi nCAT NO DEFICIT Cardiovascular: S1 S2 NO M/R/G IRREG IRREG Respiratory: CLEAR Power is 5/5   Discharge Instructions   Discharge Instructions    "Off The Beat" booklet from pharmacy   Complete by:  As directed    Diet - low sodium heart healthy   Complete by:  As directed    Discharge instructions   Complete by:  As directed    You will notice some changes to your blood pressure meds-please do not take anything other than what we prescribed-metoprolol to control your Heart We will give you Eliquis-you will not get refills but u have a trial packet--please follow up with your regular MD and see if this is a long term options   Increase activity slowly   Complete by:  As directed    Provide stroke education materials   Complete by:  As directed      Allergies as of 02/18/2017      Reactions   Oxycontin [oxycodone Hcl] Nausea And Vomiting   Codeine Nausea And Vomiting      Medication List    STOP taking these medications   amLODipine 10 MG tablet Commonly known as:  NORVASC   lisinopril 10 MG tablet Commonly known as:  PRINIVIL   meloxicam 7.5 MG tablet Commonly known as:  MOBIC     TAKE these medications   acetaminophen 500 MG tablet Commonly known as:  TYLENOL Take 500-1,000 mg by mouth as needed for pain.   apixaban 5 MG Tabs tablet Commonly known as:  ELIQUIS Take 1 tablet (5  mg total) by mouth 2 (two) times daily.   atorvastatin 40 MG tablet Commonly known as:  LIPITOR Take 1 tablet (40 mg total) by mouth daily at 6 PM.   ketoconazole 2 % cream Commonly known as:  NIZORAL Apply 1 application topically every 30 (thirty) days.   methimazole 10 MG tablet Commonly known as:  TAPAZOLE Take 1 tablet (10 mg total) by mouth daily.   metoprolol tartrate 25 MG tablet Commonly known as:  LOPRESSOR Take 1 tablet (25 mg total) by mouth 2 (two) times daily.   VENTOLIN HFA 108 (90 Base)  MCG/ACT inhaler Generic drug:  albuterol Inhale 1-2 puffs into the lungs as needed for shortness of breath or wheezing.      Allergies  Allergen Reactions  . Oxycontin [Oxycodone Hcl] Nausea And Vomiting  . Codeine Nausea And Vomiting      The results of significant diagnostics from this hospitalization (including imaging, microbiology, ancillary and laboratory) are listed below for reference.    Significant Diagnostic Studies: Ct Head Wo Contrast  Result Date: 02/16/2017 CLINICAL DATA:  Headaches for 3 days. Left hand tingling. Left hand numbness. Hypertension and blurry vision. EXAM: CT HEAD WITHOUT CONTRAST TECHNIQUE: Contiguous axial images were obtained from the base of the skull through the vertex without intravenous contrast. COMPARISON:  None. FINDINGS: Brain: There is a small focal area of vague low-attenuation demonstrated in the right posterior parietal/ occipital region. This could represent early changes of acute ischemia. Consider MRI for further characterization if clinically indicated. No mass effect or midline shift. No abnormal extra-axial fluid collections. Gray-white matter junctions are distinct. Basal cisterns are not effaced. No acute intracranial hemorrhage. Ventricles are not dilated. Vascular: No hyperdense vessel or unexpected calcification. Skull: Normal. Negative for fracture or focal lesion. Sinuses/Orbits: No acute finding. Other: None. IMPRESSION: Vague low-attenuation focus demonstrated in the right posterior parietal/ occipital region possibly representing early changes of acute ischemia. Consider MRI for further characterization if clinically indicated. No acute intracranial hemorrhage or mass effect. Electronically Signed   By: Burman NievesWilliam  Stevens M.D.   On: 02/16/2017 19:21   Mr Brain Wo Contrast  Result Date: 02/17/2017 CLINICAL DATA:  Three day history of RIGHT-sided headache. History of LEFT hand tingling and numbness. History of blurred vision. Recent  diagnosis of atrial fibrillation. EXAM: MRI HEAD WITHOUT CONTRAST MRA HEAD WITHOUT CONTRAST TECHNIQUE: Multiplanar, multiecho pulse sequences of the brain and surrounding structures were obtained without intravenous contrast. Angiographic images of the head were obtained using MRA technique without contrast. COMPARISON:  CT head 02/16/2017. FINDINGS: MRI HEAD FINDINGS Brain: Crescentic area of cortical restricted diffusion, RIGHT superior and medial occipital lobe, corresponding low ADC, correlates with the CT abnormality representing acute infarction with cytotoxic edema. A second area of acute infarction, difficult to visualize on CT, subcentimeter in size, involves the RIGHT PCA territory, medial occipital cortex near the posterior temporal lobe junction. See series 3 image 26, series 5, image 10. No other foci of acute infarction are seen. Normal for age cerebral volume. Mild subcortical and periventricular T2 and FLAIR hyperintensities, likely chronic microvascular ischemic change. Vascular: Normal flow voids. Skull and upper cervical spine: Normal marrow signal. Sinuses/Orbits: Negative. Other: None. MRA HEAD FINDINGS The internal carotid arteries are widely patent. The basilar artery is widely patent with vertebrals codominant. No intracranial stenosis or aneurysm. RIGHT PCA specifically is widely patent. IMPRESSION: Two discrete areas of acute nonhemorrhagic infarction affect the RIGHT occipital lobe. Given the normal appearing intracranial MRA, and the peripheral location  of the infarcts, the clinical impression of embolic infarction from atrial fibrillation is likely. Normal for age cerebral volume. Mild chronic microvascular ischemic change. Electronically Signed   By: Elsie Stain M.D.   On: 02/17/2017 13:37   Dg Chest Port 1 View  Result Date: 02/17/2017 CLINICAL DATA:  Atrial fibrillation with shortness of breath. EXAM: PORTABLE CHEST 1 VIEW COMPARISON:  02/16/2016. FINDINGS: Cardiac silhouette  upper limits normal size. No consolidation or edema. No effusion or pneumothorax. Mild LEFT base scarring. IMPRESSION: No active disease. Electronically Signed   By: Elsie Stain M.D.   On: 02/17/2017 10:01   Mr Maxine Glenn Head Wo Contrast  Result Date: 02/17/2017 CLINICAL DATA:  Three day history of RIGHT-sided headache. History of LEFT hand tingling and numbness. History of blurred vision. Recent diagnosis of atrial fibrillation. EXAM: MRI HEAD WITHOUT CONTRAST MRA HEAD WITHOUT CONTRAST TECHNIQUE: Multiplanar, multiecho pulse sequences of the brain and surrounding structures were obtained without intravenous contrast. Angiographic images of the head were obtained using MRA technique without contrast. COMPARISON:  CT head 02/16/2017. FINDINGS: MRI HEAD FINDINGS Brain: Crescentic area of cortical restricted diffusion, RIGHT superior and medial occipital lobe, corresponding low ADC, correlates with the CT abnormality representing acute infarction with cytotoxic edema. A second area of acute infarction, difficult to visualize on CT, subcentimeter in size, involves the RIGHT PCA territory, medial occipital cortex near the posterior temporal lobe junction. See series 3 image 26, series 5, image 10. No other foci of acute infarction are seen. Normal for age cerebral volume. Mild subcortical and periventricular T2 and FLAIR hyperintensities, likely chronic microvascular ischemic change. Vascular: Normal flow voids. Skull and upper cervical spine: Normal marrow signal. Sinuses/Orbits: Negative. Other: None. MRA HEAD FINDINGS The internal carotid arteries are widely patent. The basilar artery is widely patent with vertebrals codominant. No intracranial stenosis or aneurysm. RIGHT PCA specifically is widely patent. IMPRESSION: Two discrete areas of acute nonhemorrhagic infarction affect the RIGHT occipital lobe. Given the normal appearing intracranial MRA, and the peripheral location of the infarcts, the clinical impression of  embolic infarction from atrial fibrillation is likely. Normal for age cerebral volume. Mild chronic microvascular ischemic change. Electronically Signed   By: Elsie Stain M.D.   On: 02/17/2017 13:37    Microbiology: No results found for this or any previous visit (from the past 240 hour(s)).   Labs: Basic Metabolic Panel: Recent Labs  Lab 02/16/17 1824 02/17/17 0628 02/18/17 0314  NA 139 133* 137  K 4.0 3.4* 3.8  CL 107 102 104  CO2 24 20* 24  GLUCOSE 101* 109* 115*  BUN 9 6 11   CREATININE 0.62 0.51 0.58  CALCIUM 9.3 9.6 9.3   Liver Function Tests: Recent Labs  Lab 02/17/17 0628  AST 29  ALT 26  ALKPHOS 124  BILITOT 1.3*  PROT 8.3*  ALBUMIN 3.7   No results for input(s): LIPASE, AMYLASE in the last 168 hours. No results for input(s): AMMONIA in the last 168 hours. CBC: Recent Labs  Lab 02/16/17 1824 02/17/17 0628 02/18/17 0314  WBC 6.6 6.3 4.7  NEUTROABS 2.4  --  2.1  HGB 12.3 12.8 12.5  HCT 36.9 38.8 37.4  MCV 79.5 80.0 81.0  PLT 274 300 271   Cardiac Enzymes: Recent Labs  Lab 02/16/17 1824  TROPONINI <0.03   BNP: BNP (last 3 results) No results for input(s): BNP in the last 8760 hours.  ProBNP (last 3 results) No results for input(s): PROBNP in the last 8760 hours.  CBG:  No results for input(s): GLUCAP in the last 168 hours.     Signed:  Rhetta Mura MD   [facility] 02/18/2017, 1:07 PM

## 2017-03-12 ENCOUNTER — Other Ambulatory Visit: Payer: Self-pay

## 2017-03-12 ENCOUNTER — Emergency Department (HOSPITAL_BASED_OUTPATIENT_CLINIC_OR_DEPARTMENT_OTHER)
Admission: EM | Admit: 2017-03-12 | Discharge: 2017-03-12 | Disposition: A | Discharge status: Home / Self Care | Payer: Medicare Other | Attending: Emergency Medicine | Admitting: Emergency Medicine

## 2017-03-12 ENCOUNTER — Encounter (HOSPITAL_BASED_OUTPATIENT_CLINIC_OR_DEPARTMENT_OTHER): Payer: Self-pay | Admitting: Emergency Medicine

## 2017-03-12 DIAGNOSIS — R220 Localized swelling, mass and lump, head: Secondary | ICD-10-CM | POA: Insufficient documentation

## 2017-03-12 DIAGNOSIS — I10 Essential (primary) hypertension: Secondary | ICD-10-CM | POA: Insufficient documentation

## 2017-03-12 DIAGNOSIS — Z79899 Other long term (current) drug therapy: Secondary | ICD-10-CM | POA: Diagnosis not present

## 2017-03-12 DIAGNOSIS — R05 Cough: Secondary | ICD-10-CM | POA: Insufficient documentation

## 2017-03-12 DIAGNOSIS — Z8673 Personal history of transient ischemic attack (TIA), and cerebral infarction without residual deficits: Secondary | ICD-10-CM | POA: Diagnosis not present

## 2017-03-12 DIAGNOSIS — Z87891 Personal history of nicotine dependence: Secondary | ICD-10-CM | POA: Insufficient documentation

## 2017-03-12 DIAGNOSIS — Z7901 Long term (current) use of anticoagulants: Secondary | ICD-10-CM | POA: Insufficient documentation

## 2017-03-12 DIAGNOSIS — H04209 Unspecified epiphora, unspecified lacrimal gland: Secondary | ICD-10-CM | POA: Diagnosis not present

## 2017-03-12 DIAGNOSIS — R51 Headache: Secondary | ICD-10-CM | POA: Diagnosis not present

## 2017-03-12 DIAGNOSIS — R070 Pain in throat: Secondary | ICD-10-CM | POA: Diagnosis not present

## 2017-03-12 DIAGNOSIS — E039 Hypothyroidism, unspecified: Secondary | ICD-10-CM | POA: Diagnosis not present

## 2017-03-12 DIAGNOSIS — J3489 Other specified disorders of nose and nasal sinuses: Secondary | ICD-10-CM | POA: Diagnosis present

## 2017-03-12 MED ORDER — FLUTICASONE PROPIONATE 50 MCG/ACT NA SUSP: 2.0000 | Freq: Every day | NASAL | 2 refills | Status: AC

## 2017-03-12 MED FILL — FLUTICASONE PROP 50 MCG SPR: 50 | 30 days supply | Qty: 16 | Fill #0

## 2017-03-12 NOTE — ED Triage Notes (Signed)
Patient has chronic HTN - is worried about taking anything for pain and swelling to the right side of her nose.

## 2017-03-12 NOTE — ED Provider Notes (Signed)
[facility] EMERGENCY DEPARTMENT Provider Note   CSN: [id number] Arrival date & time: 03/12/17  4098     History   Chief Complaint Chief Complaint  Patient presents with  . Facial Swelling    HPI Evelyn Johnston is a 57 y.o. female.  HPI   Evelyn Johnston is a 57yo female with a history of atrial fibrillation (on Eliquis), hypertension, CVA, hypothyroidism, arthritis who presents to the emergency department for evaluation of nose pain and swelling.  Patient states that nose pain began about three days ago and has progressively worsened.    Pain is located on the bridge of the nose and tip of the nose, is described as "sore" and tender to the touch. States that the pain extends to the right side of the nose. She tried placing ice over the nose and tylenol without significant relief.  She does endorse rhinorrhea, itchy and watery eyes. Has recently had a dry cough with a scratchy sore throat. States that she checks her blood pressure regularly at home and her systolic bp fluctuates between 140-180, diastolic 90-110. Has a mild posterior headache, states this is similar to previous headaches. Reports she is taking her home blood pressure medications as prescribed, although she is due for a dose of her metoprolol now.  Denies fevers, chills, chest pain, shortness of breath, nausea/vomiting, numbness, weakness, visual changes, abdominal pain.   Past Medical History:  Diagnosis Date  . Arthritis   . Hypertension   . Thyroid disease     Patient Active Problem List   Diagnosis Date Noted  . Hyperthyroidism 02/17/2017  . Atrial fibrillation (HCC) 02/17/2017  . Nausea and vomiting 02/17/2017  . CVA (cerebral vascular accident) (HCC) 02/16/2017    History reviewed. No pertinent surgical history.  OB History    No data available       Home Medications    Prior to Admission medications   Medication Sig Start Date End Date Taking? Authorizing Provider  acetaminophen  (TYLENOL) 500 MG tablet Take 500-1,000 mg by mouth as needed for pain.    [provider]  albuterol (VENTOLIN HFA) 108 (90 Base) MCG/ACT inhaler Inhale 1-2 puffs into the lungs as needed for shortness of breath or wheezing. 04/07/15   [provider]  apixaban (ELIQUIS) 5 MG TABS tablet Take 1 tablet (5 mg total) by mouth 2 (two) times daily. 02/18/17   Rhetta Mura, MD  atorvastatin (LIPITOR) 40 MG tablet Take 1 tablet (40 mg total) by mouth daily at 6 PM. 02/18/17   Rhetta Mura, MD  ketoconazole (NIZORAL) 2 % cream Apply 1 application topically every 30 (thirty) days. 08/02/16   [provider]  methimazole (TAPAZOLE) 10 MG tablet Take 1 tablet (10 mg total) by mouth daily. 02/18/17   Rhetta Mura, MD  metoprolol tartrate (LOPRESSOR) 25 MG tablet Take 1 tablet (25 mg total) by mouth 2 (two) times daily. 02/18/17   Rhetta Mura, MD    Family History History reviewed. No pertinent family history.  Social History Social History   Tobacco Use  . Smoking status: Former Smoker    Packs/day: 0.50    Types: Cigarettes  . Smokeless tobacco: Never Used  Substance Use Topics  . Alcohol use: No  . Drug use: No     Allergies   Oxycontin [oxycodone hcl] and Codeine   Review of Systems Review of Systems  Constitutional: Negative for chills, fatigue and fever.  HENT: Positive for facial swelling (tip of nose swollen), rhinorrhea and  sore throat. Negative for congestion, ear pain, sinus pressure, sinus pain and trouble swallowing.   Eyes: Negative for visual disturbance.  Respiratory: Positive for cough. Negative for shortness of breath.   Cardiovascular: Negative for chest pain.  Gastrointestinal: Negative for abdominal pain, nausea and vomiting.  Genitourinary: Negative for dysuria.  Musculoskeletal: Negative for gait problem.  Skin: Negative for color change and rash.  Neurological: Positive for headaches. Negative for dizziness,  weakness, light-headedness and numbness.  Psychiatric/Behavioral: Negative for confusion.     Physical Exam Updated Vital Signs BP (!) 175/111 (BP Location: Left Arm)   Pulse (!) 59   Temp 98.2 F (36.8 C) (Oral)   Resp 18   Ht 5\' 2"  (1.575 m)   Wt 66.2 kg (146 lb)   SpO2 100%   BMI 26.70 kg/m   Physical Exam  Constitutional: She is oriented to person, place, and time. She appears well-developed and well-nourished. No distress.  HENT:  Head: Normocephalic and atraumatic.  Nose appears mildly swollen. Tender to the touch over the bridge of the nose and tip of the nose. No erythema, warmth or induration. Nasal turbinates enlarged bilaterally. No intranasal abscess or sores. Clear rhinorrhea in the nasal cavity. No maxillary or frontal sinus tenderness. Bilateral TMs with good cone of light.   Eyes: Conjunctivae are normal. Pupils are equal, round, and reactive to light. Right eye exhibits no discharge. Left eye exhibits no discharge.  Optic disc with sharp margins.   Neck: Normal range of motion. Neck supple. No JVD present. No tracheal deviation present.  Cardiovascular: Normal rate, regular rhythm and intact distal pulses. Exam reveals no friction rub.  No murmur heard. Pulmonary/Chest: Effort normal and breath sounds normal. No stridor. No respiratory distress. She has no wheezes. She has no rales.  Abdominal: Soft. Bowel sounds are normal. There is no tenderness. There is no guarding.  Musculoskeletal: Normal range of motion.  Lymphadenopathy:    She has no cervical adenopathy.  Neurological: She is alert and oriented to person, place, and time. Coordination normal.  Mental Status:  Alert, oriented, thought content appropriate, able to give a coherent history. Speech fluent without evidence of aphasia. Able to follow 2 step commands without difficulty.  Cranial Nerves:  II:  Peripheral visual fields grossly normal, pupils equal, round, reactive to light III,IV, VI: ptosis not  present, extra-ocular motions intact bilaterally  V,VII: smile symmetric, facial light touch sensation equal VIII: hearing grossly normal to voice  X: uvula elevates symmetrically  XI: bilateral shoulder shrug symmetric and strong XII: midline tongue extension without fassiculations Motor:  Normal tone. 5/5 in upper and lower extremities bilaterally including strong and equal grip strength and dorsiflexion/plantar flexion Sensory: Pinprick and light touch normal in all extremities.  Deep Tendon Reflexes: patellar reflexes 2+ and symmetric. Cerebellar: normal finger-to-nose with bilateral upper extremities Gait: normal gait and balance  Skin: Skin is warm and dry. Capillary refill takes less than 2 seconds. She is not diaphoretic.  Psychiatric: She has a normal mood and affect. Her behavior is normal.  Nursing note and vitals reviewed.    ED Treatments / Results  Labs (all labs ordered are listed, but only abnormal results are displayed) Labs Reviewed - No data to display  EKG  EKG Interpretation None       Radiology No results found.  Procedures Procedures (including critical care time)  Medications Ordered in ED Medications - No data to display   Initial Impression / Assessment and Plan / ED Course  I have reviewed the triage vital signs and the nursing notes.  Pertinent labs & imaging results that were available during my care of the patient were reviewed by me and considered in my medical decision making (see chart for details).    Presentation consistent with allergic rhinitis.  No intranasal abscess. Nose appears mildly swollen, no erythema, warmth or induration on the face to suggest cellulitis.  Airway patent. Plan to discharge patient with Flonase.   Blood pressure is elevated in the ER (175/111.) Do not suspect hypertensive emergency. She denies CP, SOB, diaphoresis, visual disturbance, numbness, weakness, urinary difficulty. She does endorse headache,  although states this feels similar to her regular headaches.  No neurological deficits on exam to suggest acute stroke. Presentation non-concerning for hypertensive encephalopathy.  Have counseled patient to follow-up with her primary care provider for blood pressure recheck and medication needs.  Counseled patient on return precautions. Patient agrees and voiced understanding to the above plan.  Discussed this patient with Dr. Fredderick PhenixBelfi who also agrees with plan and discharge.    Final Clinical Impressions(s) / ED Diagnoses   Final diagnoses:  Nose pain    ED Discharge Orders        Ordered    fluticasone (FLONASE) 50 MCG/ACT nasal spray  Daily     03/12/17 1059       Kellie ShropshireShrosbree, Tobie Perdue J, [place]PA-C 03/12/17 1849    Rolan BuccoBelfi, Melanie, MD 03/13/17 [phone]

## 2017-03-12 NOTE — ED Notes (Signed)
NAD at this time. Pt is stable and going home.  

## 2017-03-12 NOTE — Discharge Instructions (Signed)
Place warm compresses on the nose twice a day.  Use nasal spray to help with inflammation and irritation in the nose.   Please follow up with your primary doctor for blood pressure recheck and medication management.   Return to the ER if you develop chest pain, trouble breathing, have trouble seeing, worsening headache, fever >100.57F or have any new or worsening symptoms.

## 2018-10-05 IMAGING — CT CT HEAD W/O CM
3 series · 15 of 47 positions shown, 18 images · non-contrast
Comparison: None.

CLINICAL DATA: Headaches for 3 days. Left hand tingling. Left hand
numbness. Hypertension and blurry vision.

EXAM:
CT HEAD WITHOUT CONTRAST
TECHNIQUE: Contiguous axial images were obtained from the base of the skull
through the vertex without intravenous contrast.

[Series 2: head wo · axial · 0.43mm/px · z∈[+926,+1060]mm · 9 of 33 slices shown, 12 images]
[im 3/33  brain]
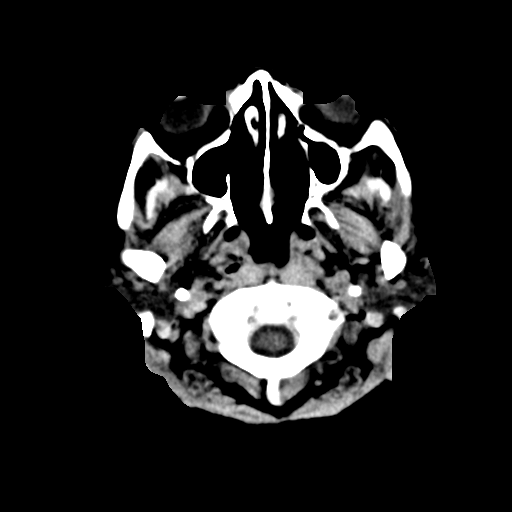
[im 3/33  bone]
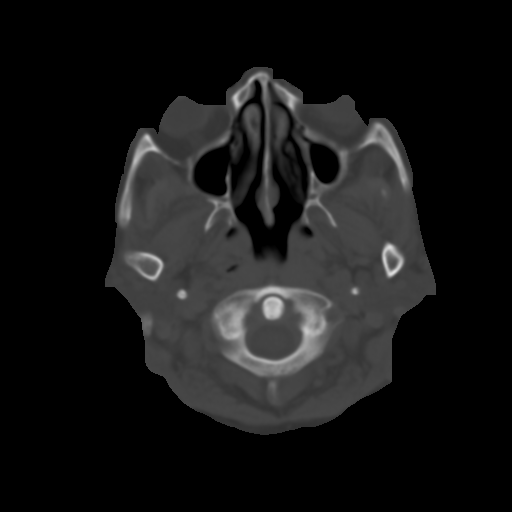
[im 6/33  brain]
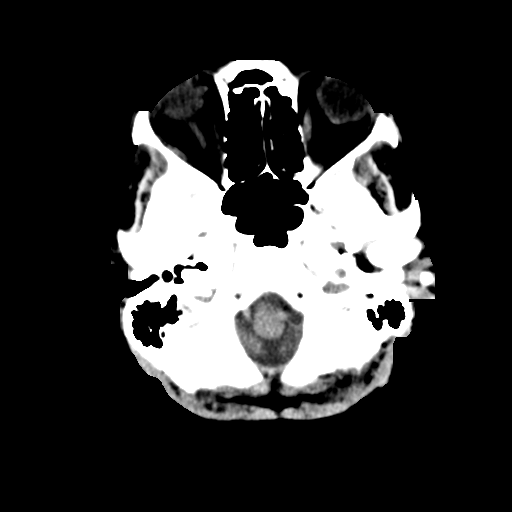
[im 9/33  brain]
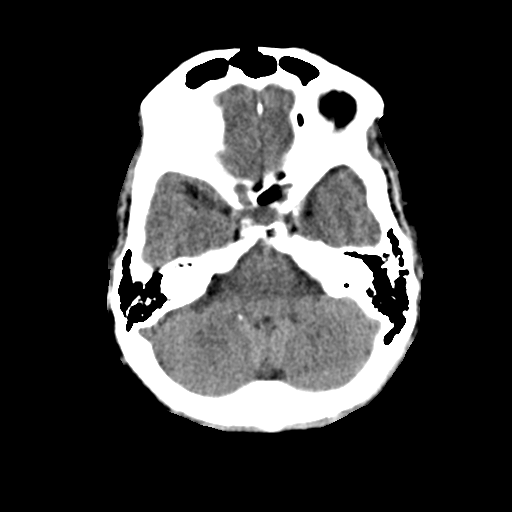
[im 13/33  brain]
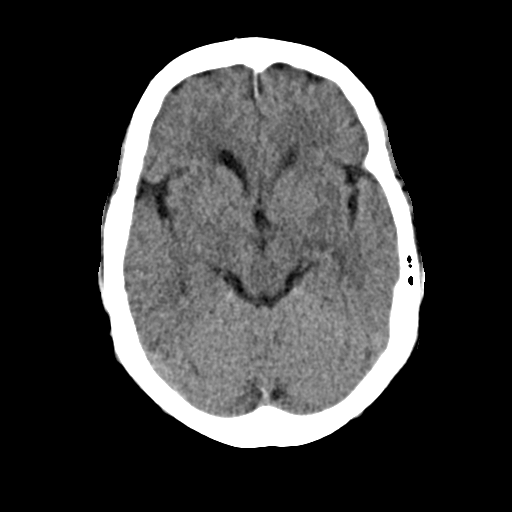
[im 17/33  brain]
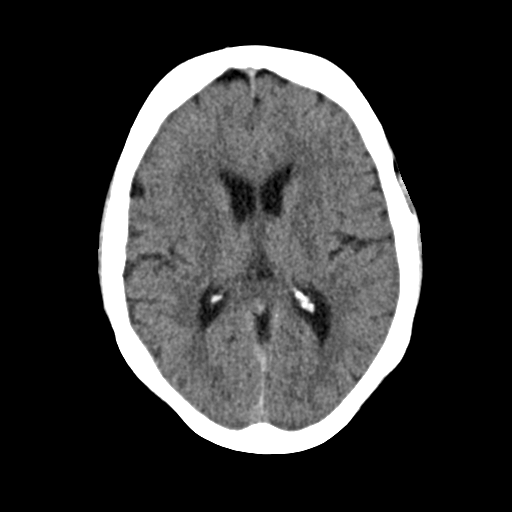
[im 17/33  bone]
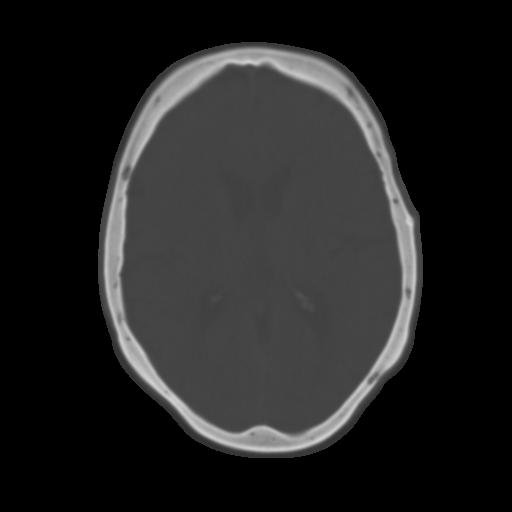
[im 20/33  brain]
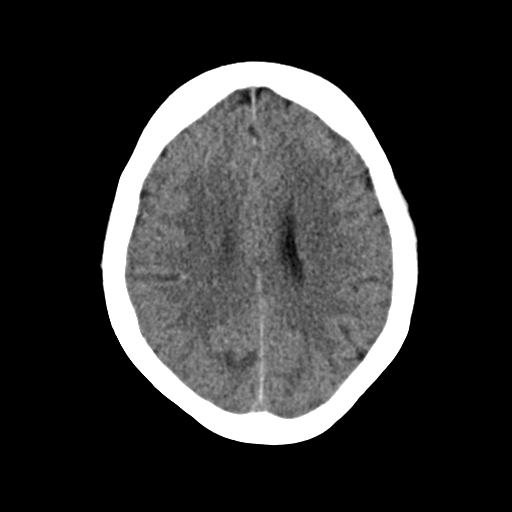
[im 24/33  brain]
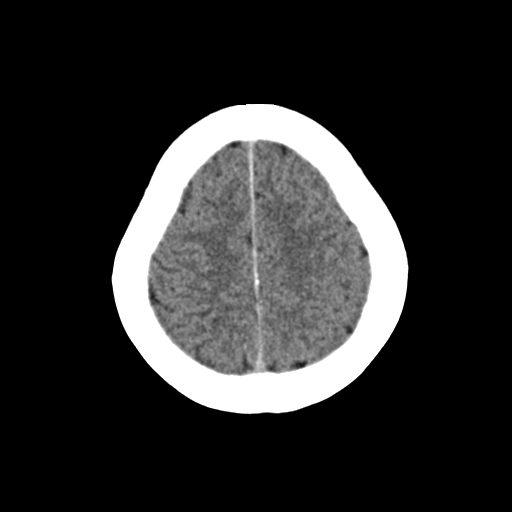
[im 27/33  brain]
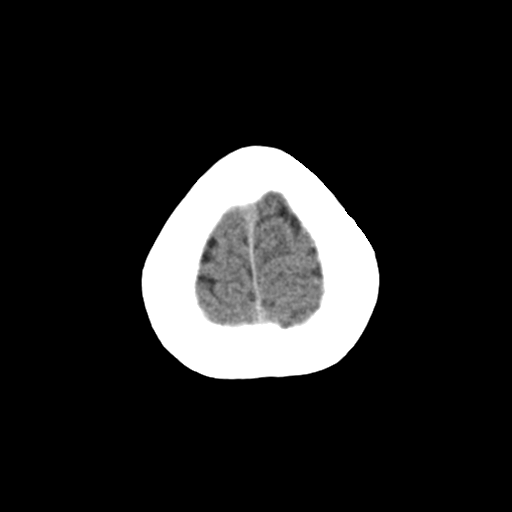
[im 30/33  brain]
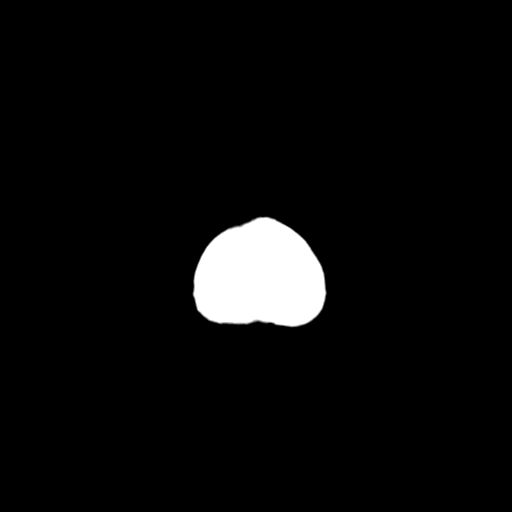
[im 30/33  bone]
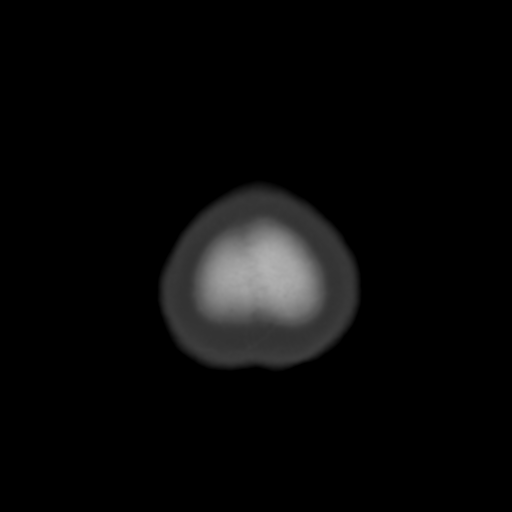

[Series 4: coronal soft · coronal · 0.32mm/px · 3 of 64 slices shown]
[im 22/64  brain]
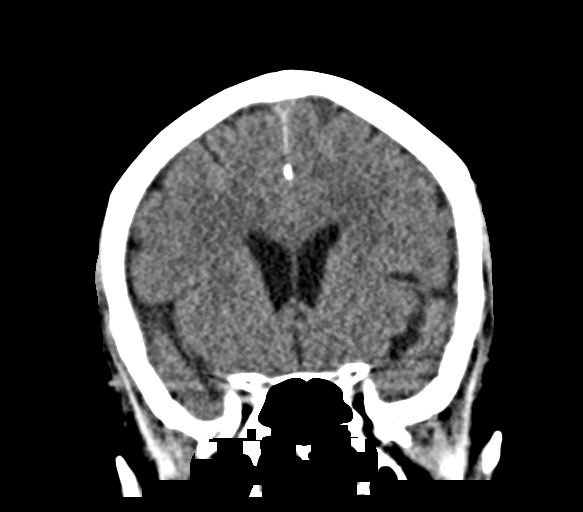
[im 29/64  brain]
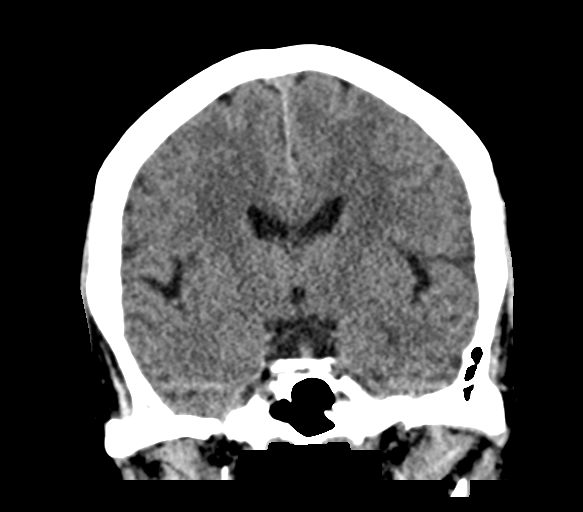
[im 36/64  brain]
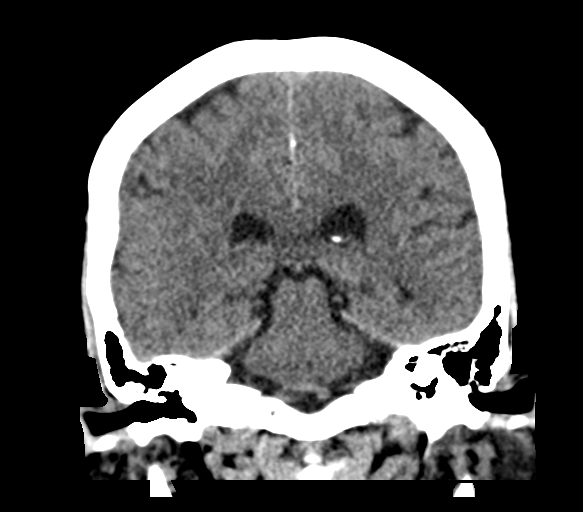

[Series 5: sag soft · sagittal · 0.34mm/px · 3 of 55 slices shown]
[im 19/55  brain]
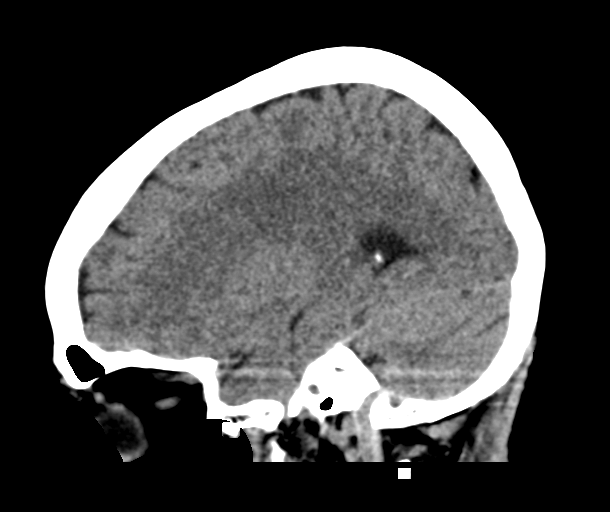
[im 28/55  brain]
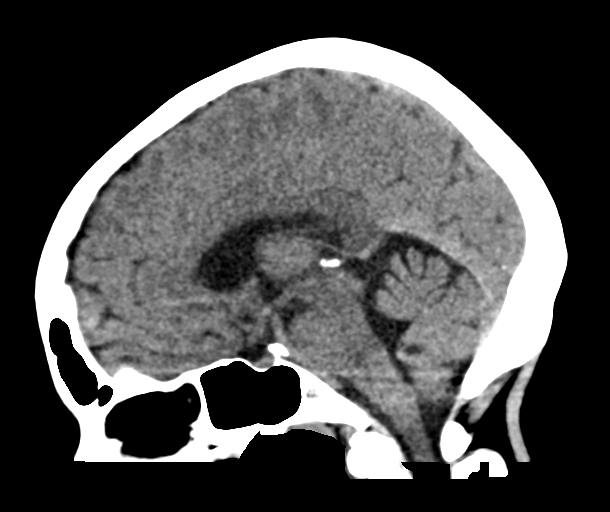
[im 37/55  brain]
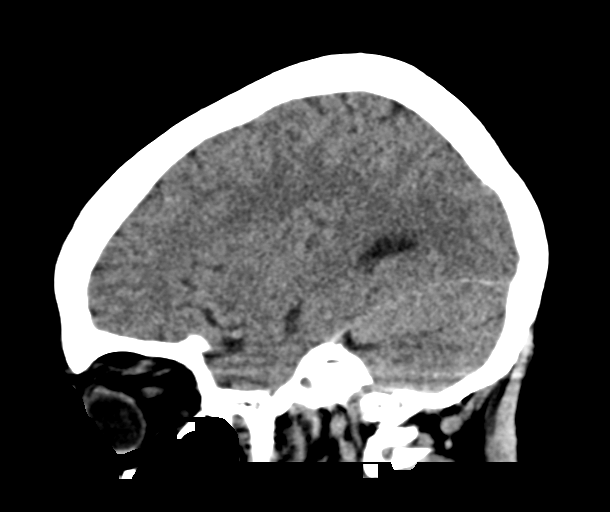

[15 of 47 positions shown; findings below may reference images not displayed]

FINDINGS: Brain: There is a small focal area of vague low-attenuation
demonstrated in the right posterior parietal/ occipital region. This
could represent early changes of acute ischemia. Consider MRI for
further characterization if clinically indicated. No mass effect or
midline shift. No abnormal extra-axial fluid collections. Gray-white
matter junctions are distinct. Basal cisterns are not effaced. No
acute intracranial hemorrhage. Ventricles are not dilated.

Vascular: No hyperdense vessel or unexpected calcification.

Skull: Normal. Negative for fracture or focal lesion.

Sinuses/Orbits: No acute finding.

Other: None.
IMPRESSION: Vague low-attenuation focus demonstrated in the right posterior
parietal/ occipital region possibly representing early changes of
acute ischemia. Consider MRI for further characterization if
clinically indicated. No acute intracranial hemorrhage or mass
effect.

## 2023-04-13 DEATH — deceased
